# Patient Record
Sex: Male | Born: 1935 | Race: Black or African American | Hispanic: No | Smoking: Never smoker
Health system: Southern US, Community
[De-identification: ages and names within clinical notes are randomized; demographics above are authoritative.]

## PROBLEM LIST (undated history)

## (undated) DIAGNOSIS — Z9989 Dependence on other enabling machines and devices: Secondary | ICD-10-CM

## (undated) DIAGNOSIS — I2510 Atherosclerotic heart disease of native coronary artery without angina pectoris: Secondary | ICD-10-CM

## (undated) DIAGNOSIS — K219 Gastro-esophageal reflux disease without esophagitis: Secondary | ICD-10-CM

## (undated) DIAGNOSIS — I739 Peripheral vascular disease, unspecified: Secondary | ICD-10-CM

## (undated) DIAGNOSIS — G4733 Obstructive sleep apnea (adult) (pediatric): Secondary | ICD-10-CM

## (undated) DIAGNOSIS — M549 Dorsalgia, unspecified: Secondary | ICD-10-CM

## (undated) DIAGNOSIS — E785 Hyperlipidemia, unspecified: Secondary | ICD-10-CM

## (undated) DIAGNOSIS — N200 Calculus of kidney: Secondary | ICD-10-CM

## (undated) DIAGNOSIS — I10 Essential (primary) hypertension: Secondary | ICD-10-CM

## (undated) DIAGNOSIS — E039 Hypothyroidism, unspecified: Secondary | ICD-10-CM

## (undated) DIAGNOSIS — N400 Enlarged prostate without lower urinary tract symptoms: Secondary | ICD-10-CM

## (undated) HISTORY — PX: CATARACT EXTRACTION: SUR2

## (undated) HISTORY — DX: Essential (primary) hypertension: I10

## (undated) HISTORY — DX: Hypothyroidism, unspecified: E03.9

## (undated) HISTORY — DX: Atherosclerotic heart disease of native coronary artery without angina pectoris: I25.10

## (undated) HISTORY — DX: Benign prostatic hyperplasia without lower urinary tract symptoms: N40.0

## (undated) HISTORY — PX: LITHOTRIPSY: SUR834

## (undated) HISTORY — PX: CARDIAC CATHETERIZATION: SHX172

## (undated) HISTORY — DX: Obstructive sleep apnea (adult) (pediatric): G47.33

## (undated) HISTORY — DX: Gastro-esophageal reflux disease without esophagitis: K21.9

## (undated) HISTORY — DX: Hyperlipidemia, unspecified: E78.5

## (undated) HISTORY — DX: Peripheral vascular disease, unspecified: I73.9

## (undated) HISTORY — DX: Dorsalgia, unspecified: M54.9

## (undated) HISTORY — DX: Dependence on other enabling machines and devices: Z99.89

## (undated) HISTORY — DX: Calculus of kidney: N20.0

---

## 2004-02-27 ENCOUNTER — Other Ambulatory Visit: Payer: Self-pay

## 2005-07-02 ENCOUNTER — Ambulatory Visit: Payer: Self-pay | Admitting: Urology

## 2005-07-14 ENCOUNTER — Ambulatory Visit: Payer: Self-pay | Admitting: Urology

## 2005-08-24 ENCOUNTER — Ambulatory Visit: Payer: Self-pay | Admitting: Urology

## 2005-08-24 ENCOUNTER — Other Ambulatory Visit: Payer: Self-pay

## 2005-09-09 ENCOUNTER — Other Ambulatory Visit: Payer: Self-pay

## 2005-09-09 ENCOUNTER — Ambulatory Visit: Payer: Self-pay | Admitting: Internal Medicine

## 2005-10-07 ENCOUNTER — Ambulatory Visit: Payer: Self-pay | Admitting: Urology

## 2005-12-24 ENCOUNTER — Ambulatory Visit: Payer: Self-pay | Admitting: Otolaryngology

## 2006-01-03 ENCOUNTER — Ambulatory Visit: Payer: Self-pay | Admitting: Otolaryngology

## 2006-01-31 ENCOUNTER — Ambulatory Visit: Payer: Self-pay | Admitting: Otolaryngology

## 2006-02-02 ENCOUNTER — Ambulatory Visit: Payer: Self-pay | Admitting: Otolaryngology

## 2006-07-11 ENCOUNTER — Ambulatory Visit: Payer: Self-pay | Admitting: Urology

## 2007-02-01 ENCOUNTER — Ambulatory Visit: Payer: Self-pay | Admitting: Urology

## 2007-02-08 ENCOUNTER — Ambulatory Visit: Payer: Self-pay | Admitting: Urology

## 2007-03-13 ENCOUNTER — Ambulatory Visit: Payer: Self-pay | Admitting: Urology

## 2007-08-16 ENCOUNTER — Ambulatory Visit: Payer: Self-pay | Admitting: Urology

## 2007-09-29 ENCOUNTER — Ambulatory Visit: Payer: Self-pay | Admitting: Otolaryngology

## 2007-12-08 IMAGING — CR DG ABDOMEN 1V
1 series · 2 of 2 positions shown · non-contrast
Comparison: none

REASON FOR EXAM: Nephrolithiasis
COMMENTS:

[Series 1: view not recorded · 0.17mm/px · 2 of 2 slices shown]
[im 1/2]
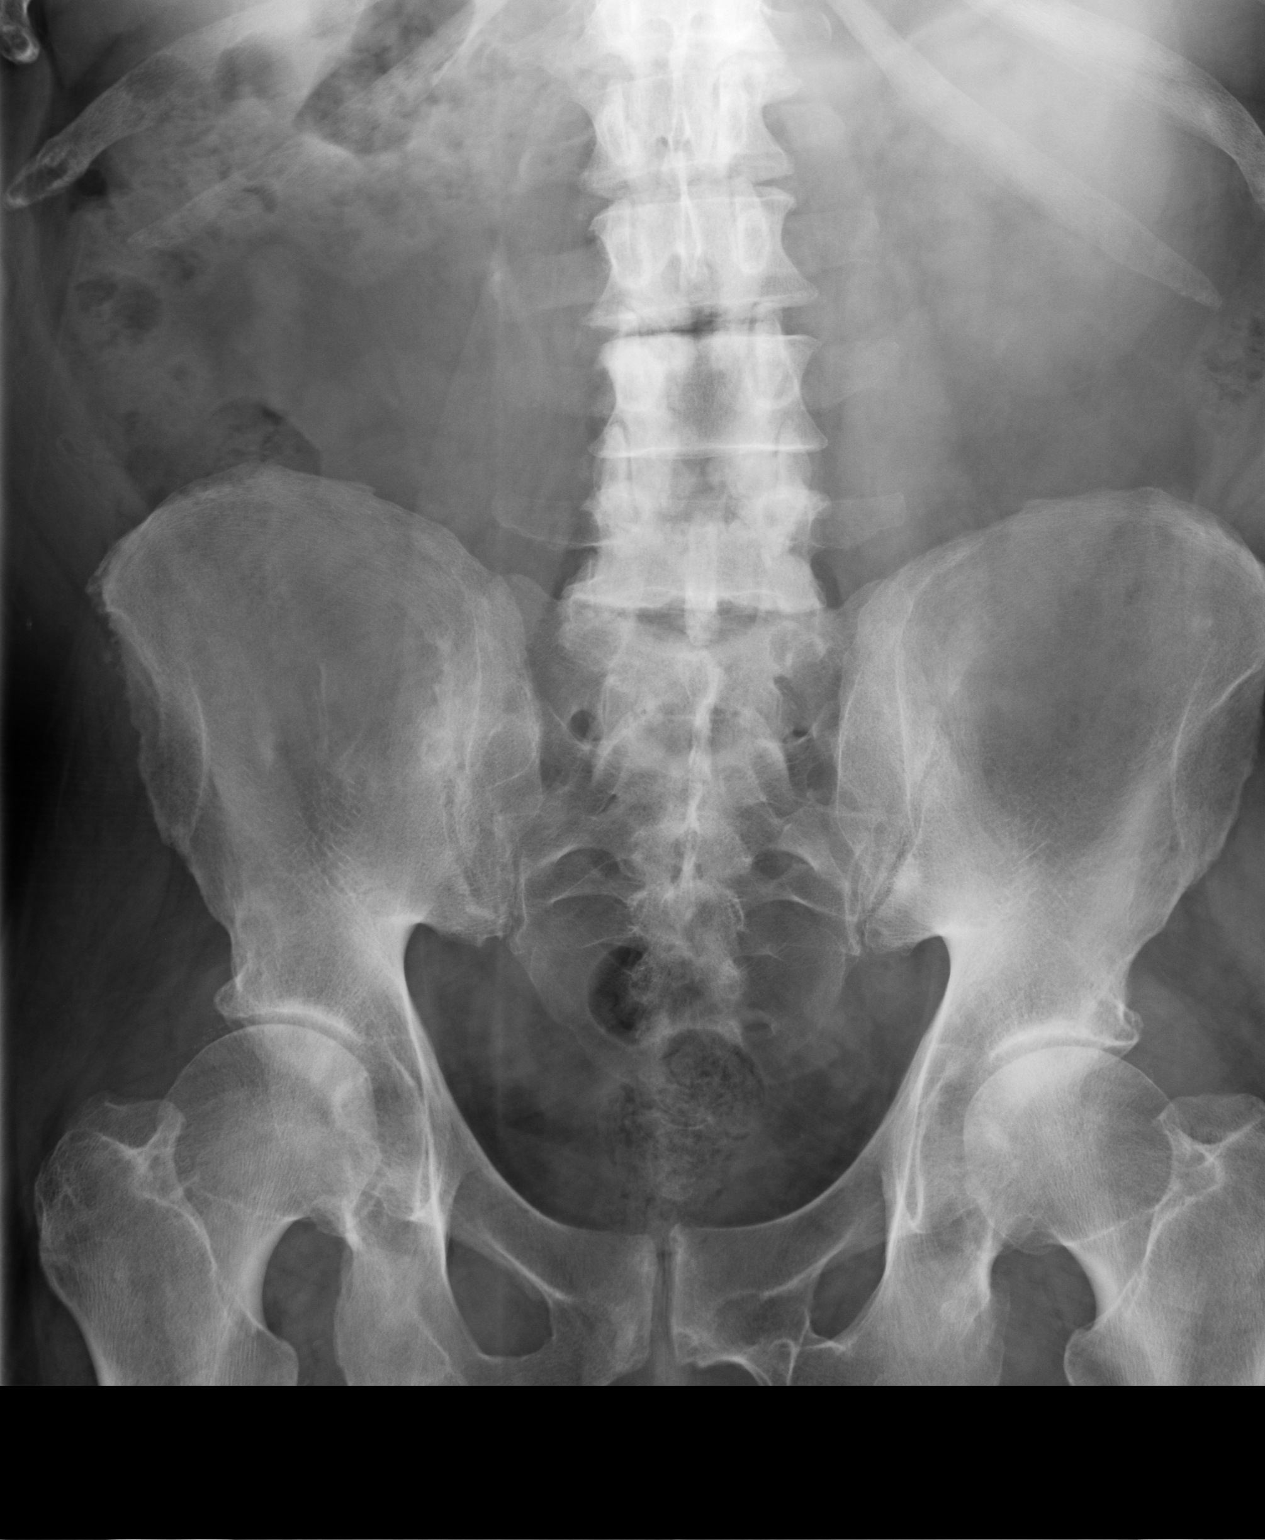
[im 2/2]
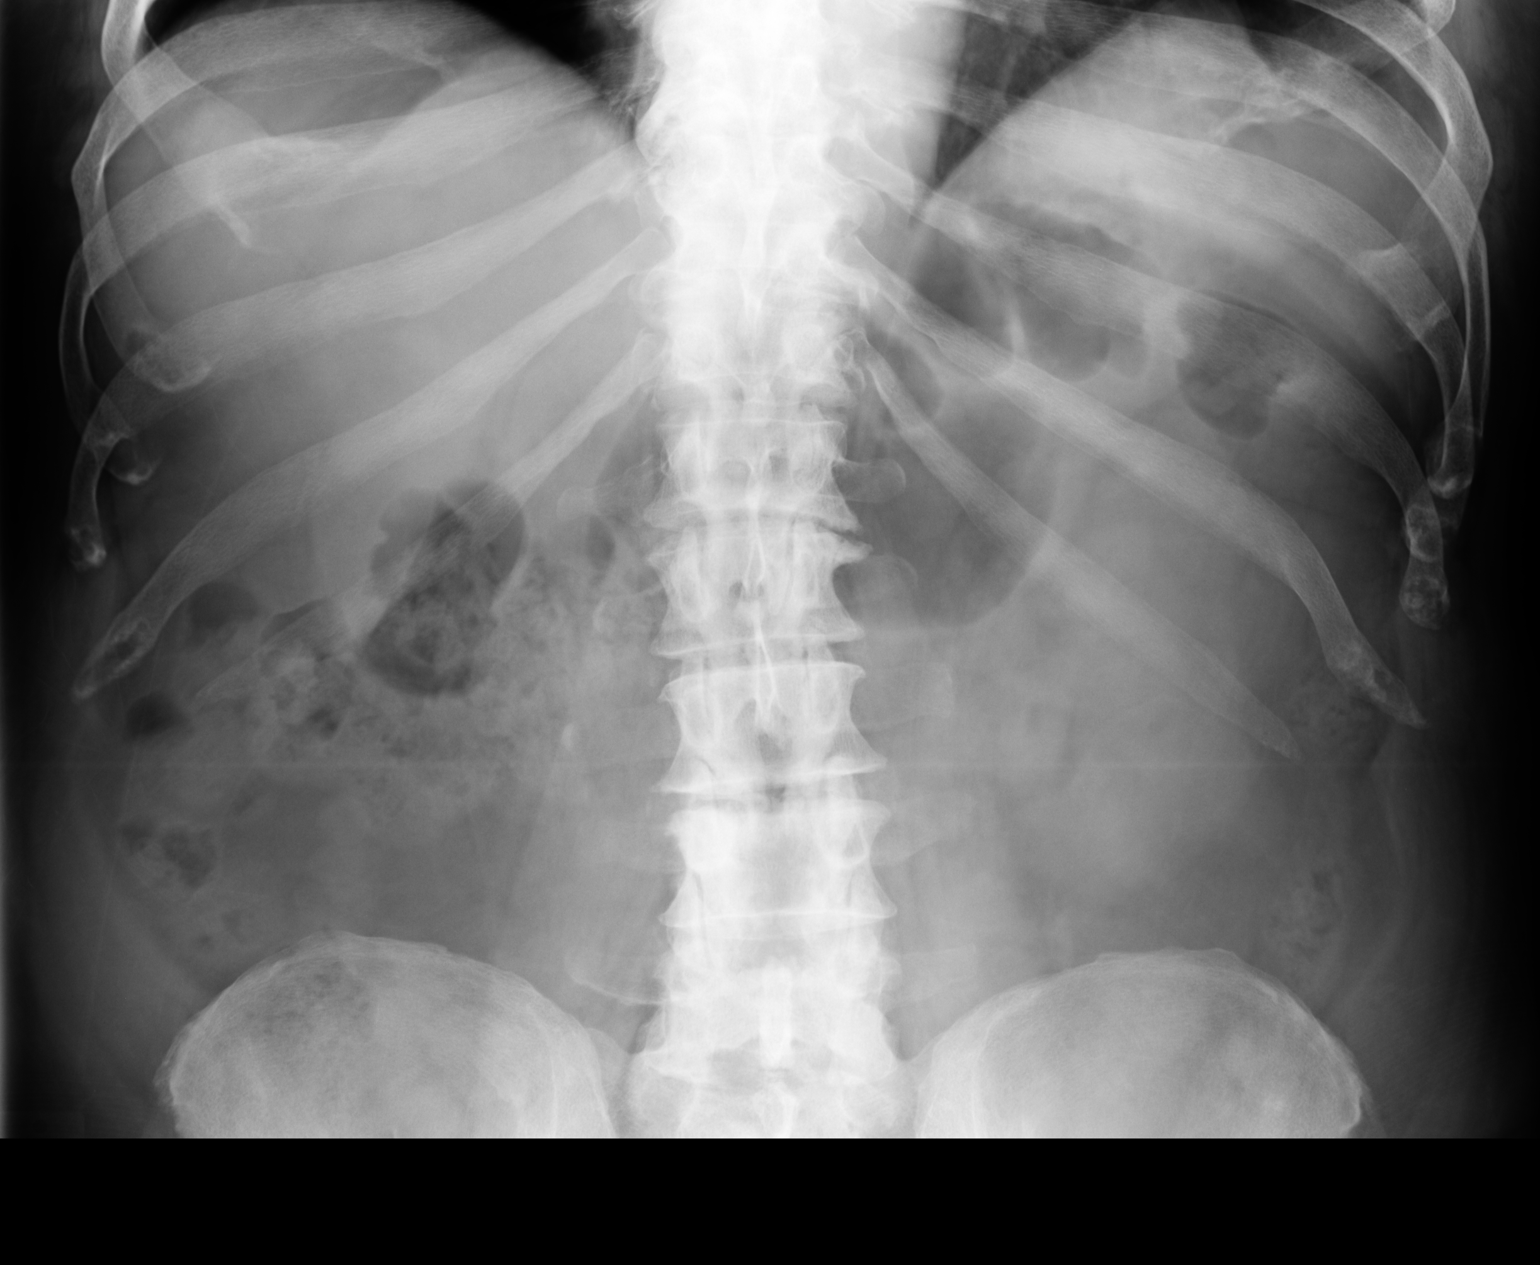

[2 of 2 positions shown; findings below may reference images not displayed]

PROCEDURE:     DXR - DXR KIDNEY URETER BLADDER  - July 14, 2005  [DATE]

RESULT:       The patient has a history of stones.
The KUB films reveal no definite abnormal calcification projecting over
either kidney.  There is a radiodensity on the RIGHT that may lie in the
proximal ureter.  It measures approximately 5 x 8 mm in diameter.  It does
overlie the tip of the RIGHT L3 transverse process and may be related to
this.  There are degenerative changes of the lumbar spine and of the RIGHT
SI joint.
IMPRESSION: I do not see evidence of more than one calcified stone.  This may lie in the
proximal RIGHT ureter.  Further evaluation with an IVP may be of value.

## 2008-02-28 ENCOUNTER — Ambulatory Visit: Payer: Self-pay | Admitting: Urology

## 2008-06-29 ENCOUNTER — Inpatient Hospital Stay: Payer: Self-pay | Admitting: Internal Medicine

## 2008-08-09 ENCOUNTER — Ambulatory Visit: Payer: Self-pay | Admitting: Urology

## 2008-10-31 ENCOUNTER — Ambulatory Visit: Payer: Self-pay | Admitting: Otolaryngology

## 2008-11-05 ENCOUNTER — Ambulatory Visit: Payer: Self-pay | Admitting: Otolaryngology

## 2008-11-25 ENCOUNTER — Ambulatory Visit: Payer: Self-pay | Admitting: Otolaryngology

## 2009-03-03 ENCOUNTER — Ambulatory Visit: Payer: Self-pay | Admitting: Urology

## 2009-03-24 ENCOUNTER — Ambulatory Visit: Payer: Self-pay | Admitting: Urology

## 2009-10-13 ENCOUNTER — Ambulatory Visit: Payer: Self-pay | Admitting: Ophthalmology

## 2009-10-28 ENCOUNTER — Ambulatory Visit: Payer: Self-pay | Admitting: Ophthalmology

## 2010-01-09 IMAGING — CR DG ABDOMEN 1V
1 series · 2 of 2 positions shown · non-contrast
Comparison: none

REASON FOR EXAM: nephrolithasis pt need films
COMMENTS:

[Series 1: view not recorded · 0.17mm/px · 2 of 2 slices shown]
[im 1/2]
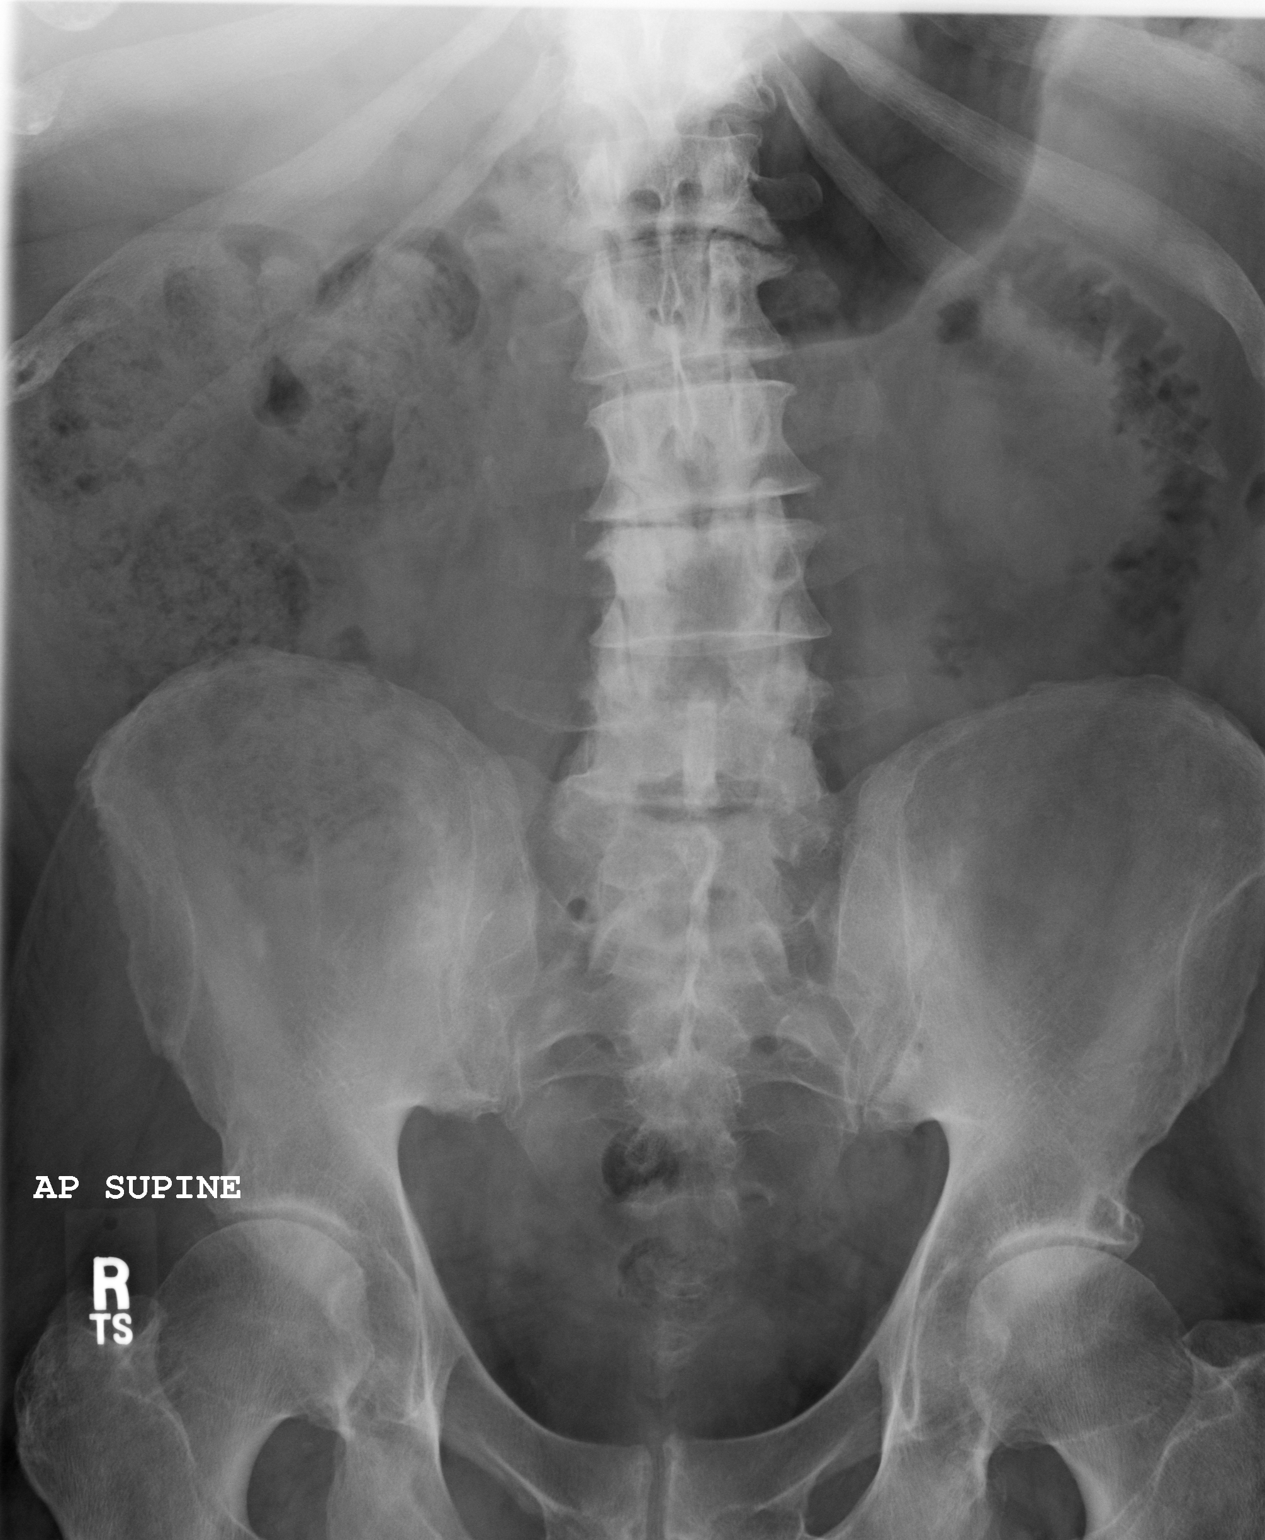
[im 2/2]
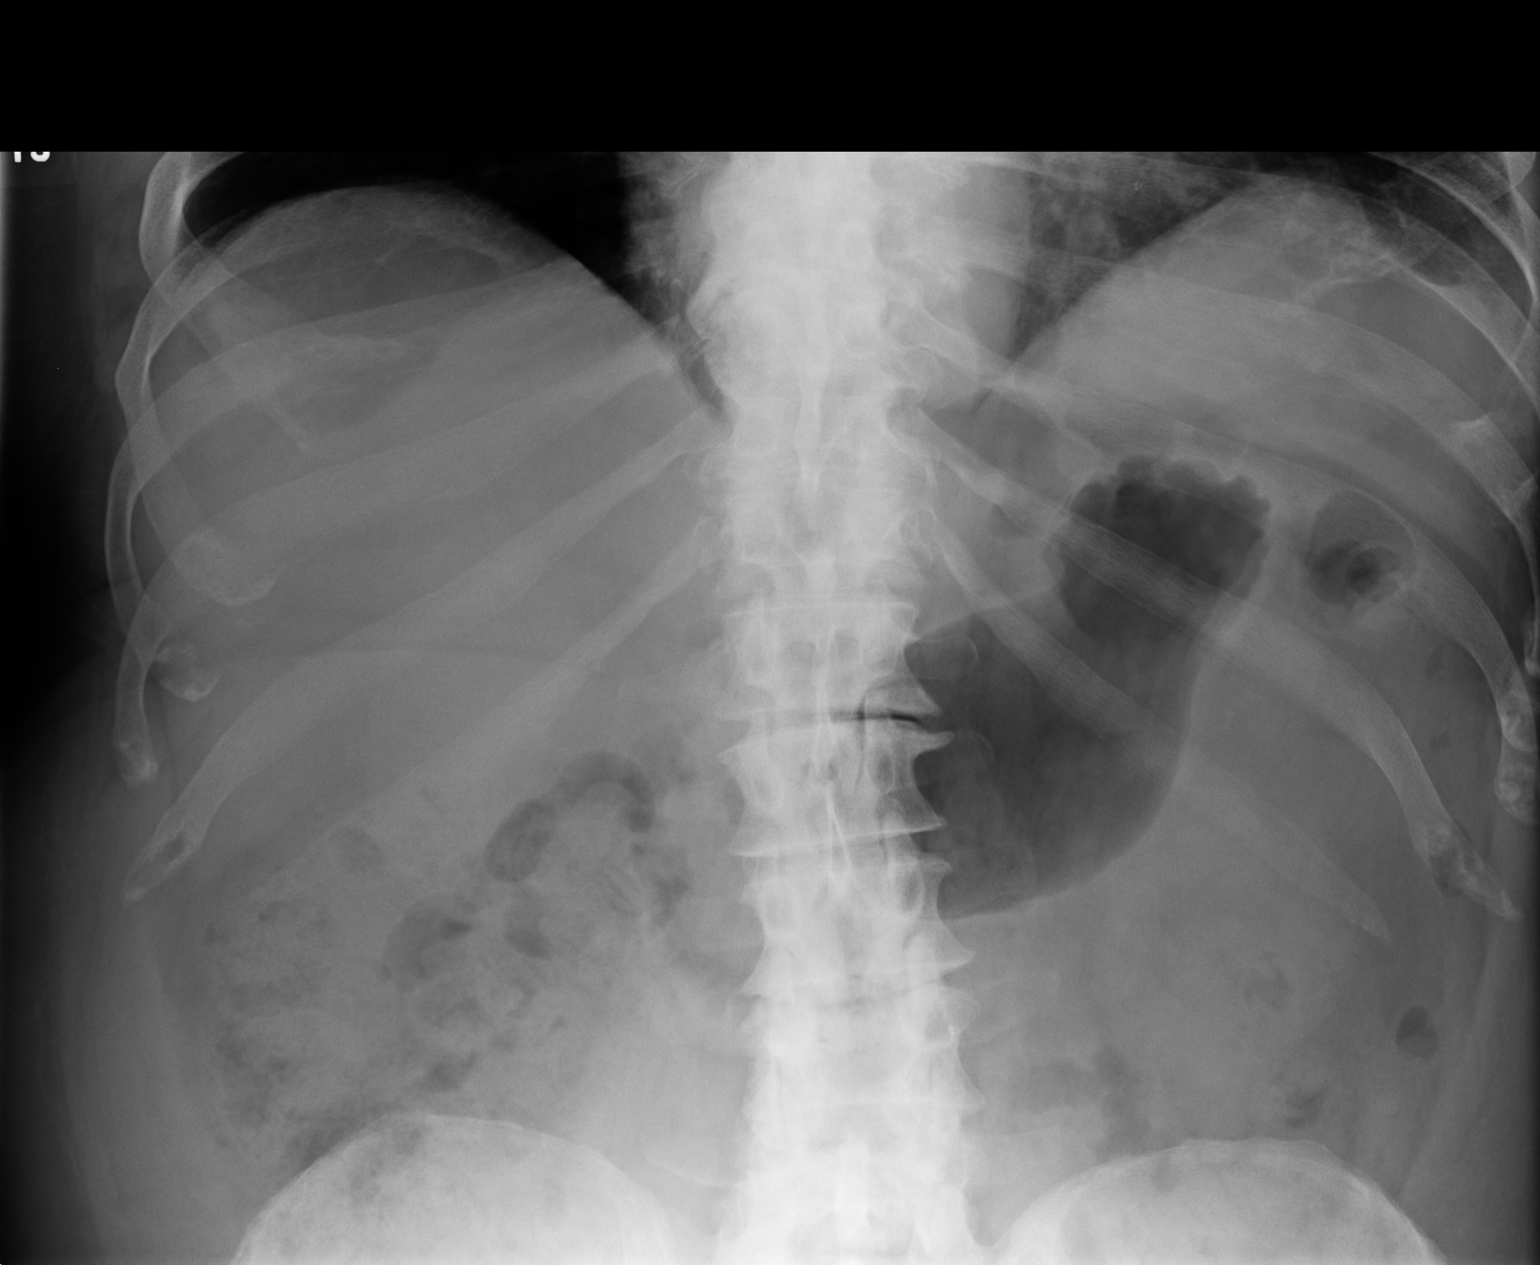

[2 of 2 positions shown; findings below may reference images not displayed]

PROCEDURE:     DXR - DXR KIDNEY URETER BLADDER  - August 16, 2007 [DATE]

RESULT:     Comparison is made to a prior exam of 03/13/2007. No definite
renal or ureteral calcifications are identified on plain film examination.
There are noted degenerative changes of the lumbar spine. There is a
moderate amount of fecal material in the colon.
IMPRESSION: 1.     No definite renal or ureteral calcifications are identified on plain
film examination.

## 2010-03-04 ENCOUNTER — Ambulatory Visit: Payer: Self-pay | Admitting: Urology

## 2010-04-23 ENCOUNTER — Ambulatory Visit: Payer: Self-pay | Admitting: Otolaryngology

## 2010-04-28 ENCOUNTER — Ambulatory Visit: Payer: Self-pay | Admitting: Otolaryngology

## 2010-05-11 ENCOUNTER — Encounter: Payer: Self-pay | Admitting: Otolaryngology

## 2010-05-26 ENCOUNTER — Encounter: Payer: Self-pay | Admitting: Otolaryngology

## 2010-06-02 ENCOUNTER — Ambulatory Visit: Payer: Self-pay | Admitting: Unknown Physician Specialty

## 2010-07-09 ENCOUNTER — Ambulatory Visit: Payer: Self-pay | Admitting: Unknown Physician Specialty

## 2010-07-10 LAB — PATHOLOGY REPORT

## 2010-07-11 ENCOUNTER — Emergency Department: Payer: Self-pay | Admitting: Unknown Physician Specialty

## 2010-07-18 ENCOUNTER — Emergency Department: Payer: Self-pay | Admitting: Emergency Medicine

## 2010-10-24 ENCOUNTER — Inpatient Hospital Stay: Payer: Self-pay | Admitting: Internal Medicine

## 2011-03-23 ENCOUNTER — Ambulatory Visit: Payer: Self-pay | Admitting: Urology

## 2011-08-02 ENCOUNTER — Emergency Department: Payer: Self-pay | Admitting: Emergency Medicine

## 2011-08-02 LAB — URINALYSIS, COMPLETE
Bacteria: NONE SEEN
Bilirubin,UR: NEGATIVE
Glucose,UR: NEGATIVE mg/dL (ref 0–75)
Nitrite: NEGATIVE
Ph: 5 (ref 4.5–8.0)
Specific Gravity: 1.016 (ref 1.003–1.030)
Squamous Epithelial: NONE SEEN

## 2011-08-12 ENCOUNTER — Ambulatory Visit: Payer: Self-pay | Admitting: Urology

## 2011-08-12 LAB — BASIC METABOLIC PANEL
Anion Gap: 10 (ref 7–16)
Chloride: 104 mmol/L (ref 98–107)
Co2: 30 mmol/L (ref 21–32)
Creatinine: 1.28 mg/dL (ref 0.60–1.30)
EGFR (Non-African Amer.): 57 — ABNORMAL LOW
Glucose: 87 mg/dL (ref 65–99)
Osmolality: 289 (ref 275–301)

## 2011-08-12 LAB — CBC WITH DIFFERENTIAL/PLATELET
Basophil #: 0 10*3/uL (ref 0.0–0.1)
Basophil %: 0.3 %
Eosinophil #: 0.1 10*3/uL (ref 0.0–0.7)
Lymphocyte #: 1.4 10*3/uL (ref 1.0–3.6)
Lymphocyte %: 13.7 %
MCHC: 32.3 g/dL (ref 32.0–36.0)
Monocyte #: 0.8 10*3/uL — ABNORMAL HIGH (ref 0.0–0.7)
Neutrophil #: 7.7 10*3/uL — ABNORMAL HIGH (ref 1.4–6.5)
Neutrophil %: 77.1 %
RBC: 4.61 10*6/uL (ref 4.40–5.90)
RDW: 16.6 % — ABNORMAL HIGH (ref 11.5–14.5)

## 2011-08-16 ENCOUNTER — Ambulatory Visit: Payer: Self-pay | Admitting: Urology

## 2012-05-03 ENCOUNTER — Ambulatory Visit: Payer: Self-pay | Admitting: Gastroenterology

## 2013-05-13 ENCOUNTER — Emergency Department: Payer: Self-pay | Admitting: Emergency Medicine

## 2013-05-13 LAB — CBC
HCT: 35.3 % — ABNORMAL LOW (ref 40.0–52.0)
HGB: 11.8 g/dL — ABNORMAL LOW (ref 13.0–18.0)
MCH: 29 pg (ref 26.0–34.0)
MCHC: 33.5 g/dL (ref 32.0–36.0)
MCV: 87 fL (ref 80–100)
RDW: 16.9 % — ABNORMAL HIGH (ref 11.5–14.5)
WBC: 6.4 10*3/uL (ref 3.8–10.6)

## 2013-05-13 LAB — COMPREHENSIVE METABOLIC PANEL
Anion Gap: 7 (ref 7–16)
BUN: 15 mg/dL (ref 7–18)
Bilirubin,Total: 0.5 mg/dL (ref 0.2–1.0)
Calcium, Total: 8.8 mg/dL (ref 8.5–10.1)
Creatinine: 1.34 mg/dL — ABNORMAL HIGH (ref 0.60–1.30)
EGFR (African American): 55 — ABNORMAL LOW
Osmolality: 282 (ref 275–301)

## 2013-05-13 LAB — URINALYSIS, COMPLETE
Bilirubin,UR: NEGATIVE
Blood: NEGATIVE
Ketone: NEGATIVE
Leukocyte Esterase: NEGATIVE
Nitrite: NEGATIVE
Ph: 7 (ref 4.5–8.0)
Protein: NEGATIVE
Specific Gravity: 1.01 (ref 1.003–1.030)

## 2013-05-13 LAB — TROPONIN I
Troponin-I: 0.13 ng/mL — ABNORMAL HIGH
Troponin-I: 0.13 ng/mL — ABNORMAL HIGH

## 2013-05-13 LAB — LIPASE, BLOOD: Lipase: 118 U/L (ref 73–393)

## 2013-10-03 ENCOUNTER — Ambulatory Visit: Payer: Self-pay | Admitting: Internal Medicine

## 2014-02-01 ENCOUNTER — Inpatient Hospital Stay: Payer: Self-pay | Admitting: Internal Medicine

## 2014-02-01 LAB — LIPASE, BLOOD: LIPASE: 156 U/L (ref 73–393)

## 2014-02-01 LAB — URINALYSIS, COMPLETE
BACTERIA: NONE SEEN
BILIRUBIN, UR: NEGATIVE
Glucose,UR: NEGATIVE mg/dL (ref 0–75)
Hyaline Cast: 12
KETONE: NEGATIVE
Leukocyte Esterase: NEGATIVE
NITRITE: NEGATIVE
Ph: 5 (ref 4.5–8.0)
Protein: 30
RBC,UR: 1 /HPF (ref 0–5)
SPECIFIC GRAVITY: 1.008 (ref 1.003–1.030)
Squamous Epithelial: NONE SEEN

## 2014-02-01 LAB — COMPREHENSIVE METABOLIC PANEL
Albumin: 3.3 g/dL — ABNORMAL LOW (ref 3.4–5.0)
Alkaline Phosphatase: 135 U/L — ABNORMAL HIGH
Anion Gap: 8 (ref 7–16)
BUN: 29 mg/dL — AB (ref 7–18)
Bilirubin,Total: 1.5 mg/dL — ABNORMAL HIGH (ref 0.2–1.0)
CO2: 24 mmol/L (ref 21–32)
Calcium, Total: 8.9 mg/dL (ref 8.5–10.1)
Chloride: 110 mmol/L — ABNORMAL HIGH (ref 98–107)
Creatinine: 2.48 mg/dL — ABNORMAL HIGH (ref 0.60–1.30)
EGFR (African American): 26 — ABNORMAL LOW
GFR CALC NON AF AMER: 22 — AB
Glucose: 85 mg/dL (ref 65–99)
OSMOLALITY: 288 (ref 275–301)
POTASSIUM: 4.5 mmol/L (ref 3.5–5.1)
SGOT(AST): 40 U/L — ABNORMAL HIGH (ref 15–37)
SGPT (ALT): 21 U/L (ref 12–78)
SODIUM: 142 mmol/L (ref 136–145)
TOTAL PROTEIN: 6.9 g/dL (ref 6.4–8.2)

## 2014-02-01 LAB — CBC
HCT: 37.3 % — ABNORMAL LOW (ref 40.0–52.0)
HGB: 11.9 g/dL — ABNORMAL LOW (ref 13.0–18.0)
MCH: 27.3 pg (ref 26.0–34.0)
MCHC: 31.8 g/dL — ABNORMAL LOW (ref 32.0–36.0)
MCV: 86 fL (ref 80–100)
Platelet: 161 10*3/uL (ref 150–440)
RBC: 4.36 10*6/uL — ABNORMAL LOW (ref 4.40–5.90)
RDW: 18.2 % — AB (ref 11.5–14.5)
WBC: 5.8 10*3/uL (ref 3.8–10.6)

## 2014-02-01 LAB — CK-MB: CK-MB: 3.6 ng/mL (ref 0.5–3.6)

## 2014-02-01 LAB — TROPONIN I
TROPONIN-I: 0.21 ng/mL — AB
TROPONIN-I: 0.23 ng/mL — AB
Troponin-I: 0.19 ng/mL — ABNORMAL HIGH

## 2014-02-01 LAB — CK TOTAL AND CKMB (NOT AT ARMC)
CK, Total: 196 U/L
CK, Total: 250 U/L
CK-MB: 4 ng/mL — ABNORMAL HIGH (ref 0.5–3.6)
CK-MB: 4.7 ng/mL — AB (ref 0.5–3.6)

## 2014-02-01 LAB — PRO B NATRIURETIC PEPTIDE: B-Type Natriuretic Peptide: 27854 pg/mL — ABNORMAL HIGH (ref 0–450)

## 2014-02-02 LAB — BASIC METABOLIC PANEL
Anion Gap: 8 (ref 7–16)
BUN: 30 mg/dL — ABNORMAL HIGH (ref 7–18)
Calcium, Total: 9.2 mg/dL (ref 8.5–10.1)
Chloride: 106 mmol/L (ref 98–107)
Co2: 28 mmol/L (ref 21–32)
Creatinine: 2.49 mg/dL — ABNORMAL HIGH (ref 0.60–1.30)
GFR CALC AF AMER: 26 — AB
GFR CALC NON AF AMER: 22 — AB
GLUCOSE: 96 mg/dL (ref 65–99)
Osmolality: 289 (ref 275–301)
POTASSIUM: 4 mmol/L (ref 3.5–5.1)
Sodium: 142 mmol/L (ref 136–145)

## 2014-02-02 LAB — CBC WITH DIFFERENTIAL/PLATELET
BASOS PCT: 0.8 %
Basophil #: 0.1 10*3/uL (ref 0.0–0.1)
EOS ABS: 0.1 10*3/uL (ref 0.0–0.7)
EOS PCT: 0.9 %
HCT: 41.5 % (ref 40.0–52.0)
HGB: 13.2 g/dL (ref 13.0–18.0)
LYMPHS ABS: 1 10*3/uL (ref 1.0–3.6)
Lymphocyte %: 14.6 %
MCH: 26.7 pg (ref 26.0–34.0)
MCHC: 31.8 g/dL — AB (ref 32.0–36.0)
MCV: 84 fL (ref 80–100)
Monocyte #: 0.8 x10 3/mm (ref 0.2–1.0)
Monocyte %: 10.9 %
NEUTROS ABS: 5 10*3/uL (ref 1.4–6.5)
Neutrophil %: 72.8 %
PLATELETS: 171 10*3/uL (ref 150–440)
RBC: 4.93 10*6/uL (ref 4.40–5.90)
RDW: 19.2 % — AB (ref 11.5–14.5)
WBC: 6.9 10*3/uL (ref 3.8–10.6)

## 2014-02-02 LAB — PHOSPHORUS: PHOSPHORUS: 3.7 mg/dL (ref 2.5–4.9)

## 2014-02-03 LAB — BASIC METABOLIC PANEL
Anion Gap: 9 (ref 7–16)
BUN: 31 mg/dL — ABNORMAL HIGH (ref 7–18)
CALCIUM: 8.8 mg/dL (ref 8.5–10.1)
CO2: 29 mmol/L (ref 21–32)
Chloride: 104 mmol/L (ref 98–107)
Creatinine: 2.35 mg/dL — ABNORMAL HIGH (ref 0.60–1.30)
EGFR (African American): 28 — ABNORMAL LOW
GFR CALC NON AF AMER: 24 — AB
Glucose: 90 mg/dL (ref 65–99)
OSMOLALITY: 289 (ref 275–301)
POTASSIUM: 3.4 mmol/L — AB (ref 3.5–5.1)
Sodium: 142 mmol/L (ref 136–145)

## 2014-02-04 LAB — BASIC METABOLIC PANEL
ANION GAP: 8 (ref 7–16)
BUN: 31 mg/dL — AB (ref 7–18)
CALCIUM: 8.7 mg/dL (ref 8.5–10.1)
CO2: 35 mmol/L — AB (ref 21–32)
Chloride: 97 mmol/L — ABNORMAL LOW (ref 98–107)
Creatinine: 2.14 mg/dL — ABNORMAL HIGH (ref 0.60–1.30)
EGFR (Non-African Amer.): 27 — ABNORMAL LOW
GFR CALC AF AMER: 31 — AB
Glucose: 85 mg/dL (ref 65–99)
Osmolality: 285 (ref 275–301)
POTASSIUM: 3.4 mmol/L — AB (ref 3.5–5.1)
Sodium: 140 mmol/L (ref 136–145)

## 2014-02-04 LAB — PROTEIN / CREATININE RATIO, URINE
CREATININE, URINE: 17.1 mg/dL — AB (ref 30.0–125.0)
Protein, Random Urine: 14 mg/dL — ABNORMAL HIGH (ref 0–12)
Protein/Creat. Ratio: 819 mg/gCREAT — ABNORMAL HIGH (ref 0–200)

## 2014-02-04 LAB — MAGNESIUM: MAGNESIUM: 1.6 mg/dL — AB

## 2014-02-06 LAB — UR PROT ELECTROPHORESIS, URINE RANDOM

## 2014-02-07 LAB — PROTEIN ELECTROPHORESIS(ARMC)

## 2014-03-05 ENCOUNTER — Ambulatory Visit: Payer: Self-pay | Admitting: Family

## 2014-03-08 ENCOUNTER — Ambulatory Visit: Payer: Self-pay | Admitting: Ophthalmology

## 2014-03-19 ENCOUNTER — Ambulatory Visit: Payer: Self-pay | Admitting: Family

## 2014-04-04 ENCOUNTER — Ambulatory Visit: Payer: Self-pay | Admitting: Family

## 2014-04-04 LAB — BASIC METABOLIC PANEL
ANION GAP: 7 (ref 7–16)
BUN: 21 mg/dL — ABNORMAL HIGH (ref 7–18)
CHLORIDE: 105 mmol/L (ref 98–107)
CO2: 30 mmol/L (ref 21–32)
Calcium, Total: 8.6 mg/dL (ref 8.5–10.1)
Creatinine: 2.13 mg/dL — ABNORMAL HIGH (ref 0.60–1.30)
EGFR (African American): 31 — ABNORMAL LOW
EGFR (Non-African Amer.): 27 — ABNORMAL LOW
Glucose: 60 mg/dL — ABNORMAL LOW (ref 65–99)
Osmolality: 284 (ref 275–301)
Potassium: 3.7 mmol/L (ref 3.5–5.1)
Sodium: 142 mmol/L (ref 136–145)

## 2014-04-05 ENCOUNTER — Encounter: Payer: Self-pay | Admitting: Cardiovascular Disease

## 2014-04-18 ENCOUNTER — Ambulatory Visit: Payer: Self-pay | Admitting: Family

## 2014-06-13 ENCOUNTER — Ambulatory Visit: Payer: Self-pay | Admitting: Family

## 2014-08-13 ENCOUNTER — Ambulatory Visit: Payer: Self-pay | Admitting: Family

## 2014-08-27 ENCOUNTER — Ambulatory Visit: Payer: Self-pay | Admitting: Family

## 2014-10-25 ENCOUNTER — Ambulatory Visit: Payer: Self-pay

## 2014-10-26 DIAGNOSIS — K219 Gastro-esophageal reflux disease without esophagitis: Secondary | ICD-10-CM

## 2014-10-26 DIAGNOSIS — I5022 Chronic systolic (congestive) heart failure: Secondary | ICD-10-CM

## 2014-11-16 NOTE — Consult Note (Signed)
Chief Complaint:  Subjective/Chief Complaint Pt states to be feeeling a little better, The swelling in his legs has improved slightly.   VITAL SIGNS/ANCILLARY NOTES: **Vital Signs.:   11-Jul-15 07:59  Vital Signs Type Pre Medication  Temperature Temperature (F) 97.1  Celsius 36.1  Temperature Source oral  Pulse Pulse 74  Respirations Respirations 20  Systolic BP Systolic BP 625  Diastolic BP (mmHg) Diastolic BP (mmHg) 88  Mean BP 111  Pulse Ox % Pulse Ox % 100  Pulse Ox Activity Level  At rest  Oxygen Delivery Room Air/ 21 %  *Intake and Output.:   11-Jul-15 11:30  Grand Totals Intake:  120 Output:      Net:  120 24 Hr.:  -280  Oral Intake      In:  120    11:30  Percentage of Meal Eaten  100  Rehab Summary:   11-Jul-15 12:50  Anticipated Discharge Disposition  home with home health   Brief Assessment:  GEN well developed, well nourished, no acute distress   Cardiac Regular  murmur present  -- LE edema  + JVD  brady   Respiratory normal resp effort   Gastrointestinal Normal   Gastrointestinal details normal Soft   EXTR negative cyanosis/clubbing, positive edema   Lab Results:  LabObservation:  10-Jul-15 09:04   OBSERVATION Reason for Test Edema    13:23   OBSERVATION Reason for Test  Hepatic:  10-Jul-15 08:12   Bilirubin, Total  1.5  Alkaline Phosphatase  135 (45-117 NOTE: New Reference Range 06/15/13)  SGPT (ALT) 21  SGOT (AST)  40  Total Protein, Serum 6.9  Albumin, Serum  3.3  Cardiology:  10-Jul-15 13:23   Echo Doppler REASON FOR EXAM:     COMMENTS:     PROCEDURE: [place] - ECHO DOPPLER COMPLETE(TRANSTHOR)  - Feb 01 2014  1:23PM   RESULT: Echocardiogram Report  Patient Name:   Dennis Rice Date of Exam: 02/01/2014 Medical Rec #:  [id number]          Custom1: Date of Birth:  [birth year]       Height:       70.0 in Patient Age:    81 years        Weight:       230.0 lb Patient Gender: M               BSA:          2.22 m??  Indications:  CHF Sonographer:    Janalee Dane RCS Referring Phys: Glad[place] Lighter  Summary:  1. Left ventricular ejection fraction, by visual estimation, is 30 to  35%.  2. Impaired relaxation pattern of LV diastolic filling.  3. Severe concentric left ventricular hypertrophy.  4. Severely increased left ventricular septal thickness.  5. Moderately reduced RV systolic function.  6. Moderately dilated left atrium.  7. Mildly dilated right atrium.  8. Mild mitral valve regurgitation.  9. Mild aortic regurgitation. 10. Mild aortic valve sclerosis without stenosis. 11. Moderate tricuspid regurgitation. 12. Moderately elevated pulmonary artery systolic pressure. 13. Severely increased left ventricular posterior wall thickness. 2D AND M-MODE MEASUREMENTS (normal ranges within parentheses): Left Ventricle:      Normal IVSd (2D):      1.90 cm (0.7-1.1) LVPWd (2D):     2.01 cm (0.7-1.1) Aorta/LA:                  Normal LVIDd (2D):     3.79 cm (3.4-5.7) Aortic Root (2D): 3.90 cm (  2.4-3.7) LVIDs (2D):     3.25 cm           Left Atrium (2D): 5.00 cm (1.9-4.0) LV FS (2D):     14.2 %   (>25%) LV EF (2D):     30.9 %   (>50%)                                   Right Ventricle:                                   RVd (2D):        3.62 cm SPECTRAL DOPPLER ANALYSIS (where applicable): Tricuspid Valve and PA/RV Systolic Pressure: TR Max Velocity: 3.52 m/s RA  Pressure: 15 mmHg RVSP/PASP: 64.6 mmHg PHYSICIAN INTERPRETATION: Left Ventricle: The left ventricular internal cavity size was normal. LV  septal wall thickness was severely increased. LV posterior wall thickness  was severely increased. Severe concentric left ventricular hypertrophy.  Left ventricular ejection fraction, by visual estimation, is 30 to 35%.  Spectral Doppler shows impaired relaxation pattern of LV diastolic  filling. Right Ventricle: The right ventricular size is normal. Global RV systolic  function is moderately reduced. Left  Atrium: The left atrium is moderately dilated. Right Atrium: The right atrium is mildly dilated. Pericardium: There is no evidence of pericardial effusion. Mitral Valve: The mitral valve is normal in structure. Mild mitral valve  regurgitation is seen. Tricuspid Valve: The tricuspid valve is normal. Moderate tricuspid   regurgitation is visualized. The tricuspid regurgitant velocity is 3.52  m/s, and with an assumed right atrial pressure of 15 mmHg, the estimated  right ventricular systolic pressure is moderately elevated at 64.6 mmHg. Aortic Valve: The aortic valve is normal. Mild aortic valve sclerosis is  present, with no evidence of aortic valve stenosis. Mild aortic valve  regurgitation is seen. Pulmonic Valve: The pulmonic valve is normal.  Maries MD Electronically signed by Cordes Lakes MD Signature Date/Time: 02/02/2014/1:09:29 PM  *** Final ***  IMPRESSION: .  Verified By: Yolonda Kida, M.D., MD  Routine Chem:  10-Jul-15 08:12   Glucose, Serum 85  BUN  29  Creatinine (comp)  2.48  Sodium, Serum 142  Potassium, Serum 4.5  Chloride, Serum  110  CO2, Serum 24  Calcium (Total), Serum 8.9  Anion Gap 8  Osmolality (calc) 288  eGFR (African American)  26  eGFR (Non-African American)  22 (eGFR values <72m/min/1.73 m2 may be an indication of chronic kidney disease (CKD). Calculated eGFR is useful in patients with stable renal function. The eGFR calculation will not be reliable in acutely ill patients when serum creatinine is changing rapidly. It is not useful in  patients on dialysis. The eGFR calculation may not be applicable to patients at the low and high extremes of body sizes, pregnant women, and vegetarians.)  Result Comment Troponin - RESULTS VERIFIED BY REPEAT TESTING.  - Notified BRoda Shutters@ [phone]  - 02/01/14.SFJ  - READ-BACK PROCESS PERFORMED.  Result(s) reported on 01 Feb 2014 at 09:07AM.  B-Type Natriuretic Peptide  ([facility]  [phone](Result(s) reported on 01 Feb 2014 at 08:58AM.)  Lipase 156 (Result(s) reported on 01 Feb 2014 at 08:58AM.)    12:03   Result Comment Troponin - RESULTS VERIFIED BY REPEAT TESTING.  - Previously called @ [phone] 02/01/14  - by  [email or web address].SFJ  Result(s) reported on 01 Feb 2014 at 01:11PM.    17:01   Result Comment TROPONIN - RESULTS VERIFIED BY REPEAT TESTING.  - PREVIOUSLY CALLED TO BRIDGET ALLISON BY  - SFJ ON 02/01/14 AT 0903. [place]  Result(s) reported on 01 Feb 2014 at 06:04PM.  11-Jul-15 06:40   BUN  30  Cardiac:  10-Jul-15 08:12   Troponin I  0.19 (0.00-0.05 0.05 ng/mL or less: NEGATIVE  Repeat testing in 3-6 hrs  if clinically indicated. >0.05 ng/mL: POTENTIAL  MYOCARDIAL INJURY. Repeat  testing in 3-6 hrs if  clinically indicated. NOTE: An increase or decrease  of 30% or more on serial  testing suggests a  clinically important change)  CPK-MB, Serum 3.6 (Result(s) reported on 01 Feb 2014 at 10:38AM.)    12:03   Troponin I  0.21 (0.00-0.05 0.05 ng/mL or less: NEGATIVE  Repeat testing in 3-6 hrs  if clinically indicated. >0.05 ng/mL: POTENTIAL  MYOCARDIAL INJURY. Repeat  testing in 3-6 hrs if  clinically indicated. NOTE: An increase or decrease  of 30% or more on serial  testing suggests a  clinically important change)  CK, Total 196 (39-308 NOTE: NEW REFERENCE RANGE  08/27/2013)  CPK-MB, Serum  4.0 (Result(s) reported on 01 Feb 2014 at 12:58PM.)    17:01   Troponin I  0.23 (0.00-0.05 0.05 ng/mL or less: NEGATIVE  Repeat testing in 3-6 hrs  if clinically indicated. >0.05 ng/mL: POTENTIAL  MYOCARDIAL INJURY. Repeat  testing in 3-6 hrs if  clinically indicated. NOTE: An increase or decrease  of 30% or more on serial  testing suggests a  clinically important change)  CK, Total 250 (39-308 NOTE: NEW REFERENCE RANGE  08/27/2013)  CPK-MB, Serum  4.7 (Result(s) reported on 01 Feb 2014 at 06:00PM.)  Routine UA:  10-Jul-15 10:20   Color (UA) Yellow   Clarity (UA) Clear  Glucose (UA) Negative  Bilirubin (UA) Negative  Ketones (UA) Negative  Specific Gravity (UA) 1.008  Blood (UA) 1+  pH (UA) 5.0  Protein (UA) 30 mg/dL  Nitrite (UA) Negative  Leukocyte Esterase (UA) Negative (Result(s) reported on 01 Feb 2014 at 10:57AM.)  RBC (UA) 1 /HPF  WBC (UA) 1 /HPF  Bacteria (UA) NONE SEEN  Epithelial Cells (UA) NONE SEEN  Hyaline Cast (UA) 12 /LPF (Result(s) reported on 01 Feb 2014 at 10:57AM.)  Routine Hem:  10-Jul-15 08:12   WBC (CBC) 5.8  RBC (CBC)  4.36  Hemoglobin (CBC)  11.9  Hematocrit (CBC)  37.3  Platelet Count (CBC) 161 (Result(s) reported on 01 Feb 2014 at 08:34AM.)  MCV 86  MCH 27.3  MCHC  31.8  RDW  18.2   Radiology Results: XRay:    10-Jul-15 09:23, Chest PA and Lateral  Chest PA and Lateral   REASON FOR EXAM:    Shortness of Breath  COMMENTS:   May transport without cardiac monitor    PROCEDURE: DXR - DXR CHEST PA (OR AP) AND LATERAL  - Feb 01 2014  9:23AM     CLINICAL DATA:  Shortness of breath    EXAM:  CHEST  2 VIEW    COMPARISON:  10/03/2013; 10/24/2010; chest CT - 06/02/2010    FINDINGS:  Grossly unchanged enlarged cardiac silhouette and mediastinal  contours given persistently reduced lung volumes. Grossly unchanged  small likely partially loculated right-sided effusion with  associated bibasilar opacities. Pulmonary vasculature remains  indistinct with cephalization of flow. Is grossly unchanged pleural  parenchymal thickening about the right minor in the  bilateral major  fissures appear no pneumothorax. Unchanged bones.     IMPRESSION:  Grossly unchanged findings of hypoventilation, pulmonary edema and  small bilateral effusions with associated bibasilar opacities, right  greater than left, atelectasis versus infiltrate.      Electronically Signed    By: Sandi Mariscal M.D.    On: 02/01/2014 09:26     Verified By: Aileen Fass, M.D.,  [place]:    10-Jul-15 09:04, [place] Color Flow Doppler Lower  Extrem Right (Leg)  [place] Color Flow Doppler Lower Extrem Right (Leg)   REASON FOR EXAM:    Edema  COMMENTS:       PROCEDURE: [place]  - US DOPPLER LOW EXTR RIGHT  - Feb 01 2014  9:04AM     CLINICAL DATA:  Right leg pain and swelling    EXAM:  Right LOWER EXTREMITY VENOUS DOPPLER ULTRASOUND    TECHNIQUE:  Gray-scale sonography with graded compression, as well as color  Doppler and duplex ultrasound were performed to evaluate the lower  extremity deep venous systems from the level of the common femoral  vein and including the common femoral, femoral, profunda femoral,  popliteal and calf veins including the posterior tibial, peroneal  and gastrocnemius veins when visible. The superficial great  saphenous vein was also interrogated. Spectral Doppler was utilized  to evaluate flow at rest and with distal augmentation maneuvers in  the common femoral, femoral and popliteal veins.    COMPARISON:  None.    FINDINGS:  Common Femoral Vein: No evidence of thrombus. Normal  compressibility, respiratory phasicity and response to augmentation.    Saphenofemoral Junction: No evidence of thrombus. Normal  compressibility and flow on color Doppler imaging.  Profunda Femoral Vein: No evidence of thrombus. Normal  compressibility and flow on color Doppler imaging.    Femoral Vein: No evidence of thrombus. Normal compressibility,  respiratory phasicity and response to augmentation.    Popliteal Vein: No evidence of thrombus. Normal compressibility,  respiratory phasicity and response to augmentation.    Calf Veins: No evidence of thrombus. Normal compressibility and flow  on color Doppler imaging.    Superficial Great Saphenous Vein: No evidence of thrombus. Normal  compressibility and flow on color Doppler imaging.  Venous Reflux:  None.    Other Findings:  Diffuse lower extremity edema is noted.     IMPRESSION:  Diffuse lower extremity edema noted. No evidence of deep venous  thrombosis in  the right leg.      Electronically Signed    By: Inez Catalina M.D.    On: 02/01/2014 09:05         Verified By: Everlene Farrier, M.D.,    10-Jul-15 11:32, US Kidney Bilateral  US Kidney Bilateral   REASON FOR EXAM:    acute renal failure  COMMENTS:       PROCEDURE: [place]  - US KIDNEY  - Feb 01 2014 11:32AM     CLINICAL DATA:  Acute renal failure    EXAM:  RENAL/URINARY TRACT ULTRASOUND COMPLETE    COMPARISON:  CT abdomen and pelvis February 08, 2007    FINDINGS:  Right Kidney:  Length: 8.4 cm. Right kidney is echogenic with renal cortical  thinning. There is a cyst in the upper pole of the right kidney  measuring 3.1 x 3.1 x 2.9 cm. There is a cyst in the medial right  kidney measuring 2.0 x 1.7 cm. A cyst also in the mid right kidney  measures 1.0 x 0.8  cm. No noncystic renal masses are identified on  the right. There is no pelvicaliectasis or perinephric fluid  collection on the right. There is no sonographically demonstrable  calculus or ureterectasis.    Left Kidney:    Length: 9.4 cm. Left kidney shows renal cortical thinning but  echogenicity within normal limits. There is a cyst arising from the  upper pole the left kidney measuring 5.5 x 4.0 x 4.9 cm. Multiple  smaller cysts are noted throughout the left kidney. No noncystic  renal masses are identified on the left. There is no  pelvicaliectasis or perinephric fluid collection on the left. There  is no sonographically demonstrable calculus or ureterectasis.    Bladder:    Appears normal for degree of bladder distention.     IMPRESSION:  Kidneys are small with renal cortical thinning. Right kidney is  echogenic. Echogenicity of the left kidney is normal. These findings  are felt to represent medical renal disease. Multiple renal cysts  may be seen with chronic renal failure. There is no obstructing  focus or noncystic renal mass on either side.  Electronically Signed    By: Lowella Grip M.D.    On:  02/01/2014 11:37         Verified By: Leafy Kindle. Jasmine December, M.D.,  Cardiology:    10-Jul-15 13:23, Echo Doppler  Echo Doppler   REASON FOR EXAM:      COMMENTS:       PROCEDURE: [facility] - ECHO DOPPLER COMPLETE(TRANSTHOR)  - Feb 01 2014  1:23PM     RESULT: Echocardiogram Report    Patient Name:   Dennis Rice Date of Exam: 02/01/2014  Medical Rec #:  [id number]          Custom1:  Date of Birth:  1923/12/10       Height:       70.0 in  Patient Age:    81 years        Weight:       230.0 lb  Patient Gender: M               BSA:          2.22 m??    Indications: CHF  Sonographer:    Janalee Dane RCS  Referring Phys: [place] Lighter    Summary:   1. Left ventricular ejection fraction, by visual estimation, is 30 to   35%.   2. Impaired relaxation pattern of LV diastolic filling.   3. Severe concentric left ventricular hypertrophy.   4. Severely increased left ventricular septal thickness.   5. Moderately reduced RV systolic function.   6. Moderately dilated left atrium.   7. Mildly dilated right atrium.   8. Mild mitral valve regurgitation.   9. Mild aortic regurgitation.  10. Mild aortic valve sclerosis without stenosis.  11. Moderate tricuspid regurgitation.  12. Moderately elevated pulmonary artery systolic pressure.  13. Severely increased left ventricular posterior wall thickness.  2D AND M-MODE MEASUREMENTS (normal ranges within parentheses):  Left Ventricle:      Normal  IVSd (2D):      1.90 cm (0.7-1.1)  LVPWd (2D):     2.01 cm (0.7-1.1) Aorta/LA:                  Normal  LVIDd (2D):     3.79 cm (3.4-5.7) Aortic Root (2D): 3.90 cm (2.4-3.7)  LVIDs (2D):     3.25 cm           Left Atrium (  2D): 5.00 cm (1.9-4.0)  LV FS (2D):     14.2 %   (>25%)  LV EF (2D):     30.9 %   (>50%)                                    Right Ventricle:                                    RVd (2D):        3.62 cm  SPECTRAL DOPPLER ANALYSIS (where applicable):  Tricuspid Valve and PA/RV Systolic  Pressure: TR Max Velocity: 3.52 m/s RA   Pressure: 15 mmHg RVSP/PASP: 64.6 mmHg  PHYSICIAN INTERPRETATION:  Left Ventricle: The left ventricular internal cavity size was normal. LV   septal wall thickness was severely increased. LV posterior wall thickness   was severely increased. Severe concentric left ventricular hypertrophy.   Left ventricular ejection fraction, by visual estimation, is 30 to 35%.   Spectral Doppler shows impaired relaxation pattern of LV diastolic   filling.  Right Ventricle: The right ventricular size is normal. Global RV systolic   function is moderately reduced.  Left Atrium: The left atrium is moderately dilated.  Right Atrium: The right atrium is mildly dilated.  Pericardium: There is no evidence of pericardial effusion.  Mitral Valve: The mitral valve is normal in structure. Mild mitral valve   regurgitation is seen.  Tricuspid Valve: The tricuspid valve is normal. Moderate tricuspid     regurgitation is visualized. The tricuspid regurgitant velocity is 3.52   m/s, and with an assumed right atrial pressure of 15 mmHg, the estimated   right ventricular systolic pressure is moderately elevated at 64.6 mmHg.  Aortic Valve: The aortic valve is normal. Mild aortic valve sclerosis is   present, with no evidence of aortic valve stenosis. Mild aortic valve   regurgitation is seen.  Pulmonic Valve: The pulmonic valve is normal.    [place] MD  Electronically signed by [place] MD  Signature Date/Time: 02/02/2014/1:09:29 PM    *** Final ***    IMPRESSION: .    Verified By: Yolonda Kida, M.D., MD  CT:    10-Jul-15 15:29, CT Chest Without Contrast  CT Chest Without Contrast   REASON FOR EXAM:    chf, atelectasis  COMMENTS:       PROCEDURE: CT  - CT CHEST WITHOUT CONTRAST  - Feb 01 2014  3:29PM     CLINICAL DATA:  Shortness of breath and cough.    EXAM:  CT CHEST WITHOUT CONTRAST    TECHNIQUE:  Multidetector CT imaging of  the chest was performed following the  standard protocol without IV contrast..    COMPARISON:  02/01/2014 and chest CT 06/02/2010  FINDINGS:  There is diffuse subcutaneous edema. The heart is at least mildly  enlarged with a small pericardial effusion. There are small  bilateral pleural effusions. The coronary arteries are heavily  calcified. There does not appear to be significant chest  lymphadenopathy but difficult to evaluate on this non-contrast  examination. Images of the upper abdomen demonstrate perihepatic and  perisplenic ascites and difficult to exclude cirrhosis. Again noted  is a low-density structure along the right kidney upper pole  suggestive for a renal cyst.    The trachea and mainstem bronchi are patent. There  are patchy  parenchymal densities in the medial right upper lobe. There is a  linear or tubular shaped density in the right middle lobe on  sequence 3, image 32. Volume loss in the right lower lobe with  patchy parenchymal densities. Pleural-based density in the right  middle lobe. There is volume loss in the left lower lobe with  interstitial densities. Streaky parenchymal densities in the  lingula.    Multilevel degenerative changes in the thoracic spine.     IMPRESSION:  Scattered areas of parenchymal lung disease, most prominent in  medial right upper lobe. Findings are suggestive for multi-focal  pneumonia.    Evidence for anasarca demonstrated by subcutaneous edema, small  pleural effusions, small pericardial effusion and abdominal ascites.  Difficult to exclude underlying liver disease.  Coronary arteries are heavily calcified.      Electronically Signed    By: Markus Daft M.D.    On: 02/01/2014 16:01         Verified By: Burman Riis, M.D.,   Assessment/Plan:  Assessment/Plan:  Assessment IMP CHF Edema AFIB Bradycardia HTN CM Demand ischemia CRI .   Plan PLAN Tele Diuretics Bp control Support stocking Agree wiith  PT/OT ECHO with mod severeCM I do not rec cath Continue conservative CHF therapy with meds Continue Eliquis/Atenolol for AFIB   Electronic Signatures: Yolonda Kida (MD)  (Signed 12-Jul-15 07:19)  Authored: Chief Complaint, VITAL SIGNS/ANCILLARY NOTES, Brief Assessment, Lab Results, Radiology Results, Assessment/Plan   Last Updated: 12-Jul-15 07:19 by Lujean Amel D (MD)

## 2014-11-16 NOTE — Consult Note (Signed)
Chief Complaint:  Subjective/Chief Complaint He still has weakness and edema.Pt states to be feeeling a little better, The swelling in his legs has improved slightly. He abulating to the bathroom.   VITAL SIGNS/ANCILLARY NOTES: **Vital Signs.:   12-Jul-15 11:40  Vital Signs Type Routine  Pulse Pulse 77  Respirations Respirations 22  Systolic BP Systolic BP 115  Diastolic BP (mmHg) Diastolic BP (mmHg) 82  Mean BP 100  Pulse Ox % Pulse Ox % 98  Pulse Ox Activity Level  At rest  Oxygen Delivery Room Air/ 21 %  *Intake and Output.:   12-Jul-15 08:15  Grand Totals Intake:  240 Output:      Net:  240 24 Hr.:  240  Oral Intake      In:  240  Percentage of Meal Eaten  100   Brief Assessment:  GEN well developed, well nourished, no acute distress   Cardiac Regular  murmur present  -- LE edema  + JVD  brady   Respiratory normal resp effort   Gastrointestinal Normal   Gastrointestinal details normal Soft   EXTR negative cyanosis/clubbing, positive edema   Lab Results: Routine Chem:  12-Jul-15 07:33   Glucose, Serum 90  BUN  31  Creatinine (comp)  2.35  Sodium, Serum 142  Potassium, Serum  3.4  Chloride, Serum 104  CO2, Serum 29  Calcium (Total), Serum 8.8  Anion Gap 9  Osmolality (calc) 289  eGFR (African American)  28  eGFR (Non-African American)  24 (eGFR values <79m/min/1.73 m2 may be an indication of chronic kidney disease (CKD). Calculated eGFR is useful in patients with stable renal function. The eGFR calculation will not be reliable in acutely ill patients when serum creatinine is changing rapidly. It is not useful in  patients on dialysis. The eGFR calculation may not be applicable to patients at the low and high extremes of body sizes, pregnant women, and vegetarians.)   Radiology Results: XRay:    10-Jul-15 09:23, Chest PA and Lateral  Chest PA and Lateral   REASON FOR EXAM:    Shortness of Breath  COMMENTS:   May transport without cardiac  monitor    PROCEDURE: DXR - DXR CHEST PA (OR AP) AND LATERAL  - Feb 01 2014  9:23AM     CLINICAL DATA:  Shortness of breath    EXAM:  CHEST  2 VIEW    COMPARISON:  10/03/2013; 10/24/2010; chest CT - 06/02/2010    FINDINGS:  Grossly unchanged enlarged cardiac silhouette and mediastinal  contours given persistently reduced lung volumes. Grossly unchanged  small likely partially loculated right-sided effusion with  associated bibasilar opacities. Pulmonary vasculature remains  indistinct with cephalization of flow. Is grossly unchanged pleural  parenchymal thickening about the right minor in the bilateral major  fissures appear no pneumothorax. Unchanged bones.     IMPRESSION:  Grossly unchanged findings of hypoventilation, pulmonary edema and  small bilateral effusions with associated bibasilar opacities, right  greater than left, atelectasis versus infiltrate.      Electronically Signed    By: JSandi MariscalM.D.    On: 02/01/2014 09:26     Verified By: JAileen Fass M.D.,  [place]    10-Jul-15 09:04, [place] Flow Doppler Lower Extrem Right (Leg)  [place] Flow Doppler Lower Extrem Right (Leg)   REASON FOR EXAM:    Edema  COMMENTS:       PROCEDURE: [place] - [place]OW EXTR RIGHT  - Feb 01 2014  9:04AM     CLINICAL DATA:  Right leg pain and swelling    EXAM:  Right LOWER EXTREMITY VENOUS DOPPLER ULTRASOUND    TECHNIQUE:  Gray-scale sonography with graded compression, as well as color  Doppler and duplex ultrasound were performed to evaluate the lower  extremity deep venous systems from the level of the common femoral  vein and including the common femoral, femoral, profunda femoral,  popliteal and calf veins including the posterior tibial, peroneal  and gastrocnemius veins when visible. The superficial great  saphenous vein was also interrogated. Spectral Doppler was utilized  to evaluate flow at rest and with distal augmentation maneuvers in  the common femoral,  femoral and popliteal veins.    COMPARISON:  None.    FINDINGS:  Common Femoral Vein: No evidence of thrombus. Normal  compressibility, respiratory phasicity and response to augmentation.    Saphenofemoral Junction: No evidence of thrombus. Normal  compressibility and flow on color Doppler imaging.  Profunda Femoral Vein: No evidence of thrombus. Normal  compressibility and flow on color Doppler imaging.    Femoral Vein: No evidence of thrombus. Normal compressibility,  respiratory phasicity and response to augmentation.    Popliteal Vein: No evidence of thrombus. Normal compressibility,  respiratory phasicity and response to augmentation.    Calf Veins: No evidence of thrombus. Normal compressibility and flow  on color Doppler imaging.    Superficial Great Saphenous Vein: No evidence of thrombus. Normal  compressibility and flow on color Doppler imaging.  Venous Reflux:  None.    Other Findings:  Diffuse lower extremity edema is noted.     IMPRESSION:  Diffuse lower extremity edema noted. No evidence of deep venous  thrombosis in the right leg.      Electronically Signed    By: Inez Catalina M.D.    On: 02/01/2014 09:05         Verified By: Everlene Farrier, M.D.,    10-Jul-15 11:32, US Kidney Bilateral  US Kidney Bilateral   REASON FOR EXAM:    acute renal failure  COMMENTS:       PROCEDURE: [place]  - US KIDNEY  - Feb 01 2014 11:32AM     CLINICAL DATA:  Acute renal failure    EXAM:  RENAL/URINARY TRACT ULTRASOUND COMPLETE    COMPARISON:  CT abdomen and pelvis February 08, 2007    FINDINGS:  Right Kidney:  Length: 8.4 cm. Right kidney is echogenic with renal cortical  thinning. There is a cyst in the upper pole of the right kidney  measuring 3.1 x 3.1 x 2.9 cm. There is a cyst in the medial right  kidney measuring 2.0 x 1.7 cm. A cyst also in the mid right kidney  measures 1.0 x 0.8 cm. No noncystic renal masses are identified on  the right. There is no  pelvicaliectasis or perinephric fluid  collection on the right. There is no sonographically demonstrable  calculus or ureterectasis.    Left Kidney:    Length: 9.4 cm. Left kidney shows renal cortical thinning but  echogenicity within normal limits. There is a cyst arising from the  upper pole the left kidney measuring 5.5 x 4.0 x 4.9 cm. Multiple  smaller cysts are noted throughout the left kidney. No noncystic  renal masses are identified on the left. There is no  pelvicaliectasis or perinephric fluid collection on the left. There  is no sonographically demonstrable calculus or ureterectasis.    Bladder:    Appears normal for  degree of bladder distention.     IMPRESSION:  Kidneys are small with renal cortical thinning. Right kidney is  echogenic. Echogenicity of the left kidney is normal. These findings  are felt to represent medical renal disease. Multiple renal cysts  may be seen with chronic renal failure. There is no obstructing  focus or noncystic renal mass on either side.  Electronically Signed    By: Lowella Grip M.D.    On: 02/01/2014 11:37         Verified By: Leafy Kindle. Jasmine December, M.D.,  Cardiology:    10-Jul-15 08:29, ED ECG  Ventricular Rate 69  Atrial Rate 300  QRS Duration 86  QT 428  QTc 458  R Axis -4  T Axis 169  ECG interpretation   Atrial fibrillation with premature ventricular or aberrantly conducted complexes  Low voltage QRS  Cannot rule out Anterior infarct (cited on or before 13-May-2013)  Abnormal ECG  When compared with ECG of 13-May-2013 17:04,  No significant change was found  ----------unconfirmed----------  Confirmed by OVERREAD, NOT ([id number]), editor PEARSON, BARBARA ([id number]) on 02/04/2014 8:19:54 AM  ED ECG     10-Jul-15 13:23, Echo Doppler  Echo Doppler   REASON FOR EXAM:      COMMENTS:       PROCEDURE: [place] - ECHO DOPPLER COMPLETE(TRANSTHOR)  - Feb 01 2014  1:23PM     RESULT: Echocardiogram Report    Patient Name:   Dennis Rice Date of Exam: 02/01/2014  Medical Rec #:  [id number]          Custom1:  Date of Birth:  11/02/1923       Height:       70.0 in  Patient Age:    81 years        Weight:       230.0 lb  Patient Gender: M               BSA:          2.22 m??    Indications: CHF  Sonographer:    Janalee Dane RCS  Referring Phys: [place] Lighter    Summary:   1. Left ventricular ejection fraction, by visual estimation, is 30 to   35%.   2. Impaired relaxation pattern of LV diastolic filling.   3. Severe concentric left ventricular hypertrophy.   4. Severely increased left ventricular septal thickness.   5. Moderately reduced RV systolic function.   6. Moderately dilated left atrium.   7. Mildly dilated right atrium.   8. Mild mitral valve regurgitation.   9. Mild aortic regurgitation.  10. Mild aortic valve sclerosis without stenosis.  11. Moderate tricuspid regurgitation.  12. Moderately elevated pulmonary artery systolic pressure.  13. Severely increased left ventricular posterior wall thickness.  2D AND M-MODE MEASUREMENTS (normal ranges within parentheses):  Left Ventricle:      Normal  IVSd (2D):      1.90 cm (0.7-1.1)  LVPWd (2D):     2.01 cm (0.7-1.1) Aorta/LA:                  Normal  LVIDd (2D):     3.79 cm (3.4-5.7) Aortic Root (2D): 3.90 cm (2.4-3.7)  LVIDs (2D):     3.25 cm           Left Atrium (2D): 5.00 cm (1.9-4.0)  LV FS (2D):     14.2 %   (>25%)  LV EF (2D):     30.9 %   (>  50%)                                    Right Ventricle:                                    RVd (2D):        3.62 cm  SPECTRAL DOPPLER ANALYSIS (where applicable):  Tricuspid Valve and PA/RV Systolic Pressure: TR Max Velocity: 3.52 m/s RA   Pressure: 15 mmHg RVSP/PASP: 64.6 mmHg  PHYSICIAN INTERPRETATION:  Left Ventricle: The left ventricular internal cavity size was normal. LV   septal wall thickness was severely increased. LV posterior wall thickness   was severely increased. Severe concentric left  ventricular hypertrophy.   Left ventricular ejection fraction, by visual estimation, is 30 to 35%.   Spectral Doppler shows impaired relaxation pattern of LV diastolic   filling.  Right Ventricle: The right ventricular size is normal. Global RV systolic   function is moderately reduced.  Left Atrium: The left atrium is moderately dilated.  Right Atrium: The right atrium is mildly dilated.  Pericardium: There is no evidence of pericardial effusion.  Mitral Valve: The mitral valve is normal in structure. Mild mitral valve   regurgitation is seen.  Tricuspid Valve: The tricuspid valve is normal. Moderate tricuspid     regurgitation is visualized. The tricuspid regurgitant velocity is 3.52   m/s, and with an assumed right atrial pressure of 15 mmHg, the estimated   right ventricular systolic pressure is moderately elevated at 64.6 mmHg.  Aortic Valve: The aortic valve is normal. Mild aortic valve sclerosis is   present, with no evidence of aortic valve stenosis. Mild aortic valve   regurgitation is seen.  Pulmonic Valve: The pulmonic valve is normal.    [place] MD  Electronically signed by [place] MD  Signature Date/Time: 02/02/2014/1:09:29 PM    *** Final ***    IMPRESSION: .    Verified By: Yolonda Kida, M.D., MD  CT:    10-Jul-15 15:29, CT Chest Without Contrast  CT Chest Without Contrast   REASON FOR EXAM:    chf, atelectasis  COMMENTS:       PROCEDURE: CT  - CT CHEST WITHOUT CONTRAST  - Feb 01 2014  3:29PM     CLINICAL DATA:  Shortness of breath and cough.    EXAM:  CT CHEST WITHOUT CONTRAST    TECHNIQUE:  Multidetector CT imaging of the chest was performed following the  standard protocol without IV contrast..    COMPARISON:  02/01/2014 and chest CT 06/02/2010  FINDINGS:  There is diffuse subcutaneous edema. The heart is at least mildly  enlarged with a small pericardial effusion. There are small  bilateral pleural effusions. The  coronary arteries are heavily  calcified. There does not appear to be significant chest  lymphadenopathy but difficult to evaluate on this non-contrast  examination. Images of the upper abdomen demonstrate perihepatic and  perisplenic ascites and difficult to exclude cirrhosis. Again noted  is a low-density structure along the right kidney upper pole  suggestive for a renal cyst.    The trachea and mainstem bronchi are patent. There are patchy  parenchymal densities in the medial right upper lobe. There is a  linear or tubular shaped density in the right middle lobe on  sequence 3,  image 32. Volume loss in the right lower lobe with  patchy parenchymal densities. Pleural-based density in the right  middle lobe. There is volume loss in the left lower lobe with  interstitial densities. Streaky parenchymal densities in the  lingula.    Multilevel degenerative changes in the thoracic spine.     IMPRESSION:  Scattered areas of parenchymal lung disease, most prominent in  medial right upper lobe. Findings are suggestive for multi-focal  pneumonia.    Evidence for anasarca demonstrated by subcutaneous edema, small  pleural effusions, small pericardial effusion and abdominal ascites.  Difficult to exclude underlying liver disease.  Coronary arteries are heavily calcified.      Electronically Signed    By: Markus Daft M.D.    On: 02/01/2014 16:01         Verified By: Burman Riis, M.D.,   Assessment/Plan:  Assessment/Plan:  Assessment IMP Weakness CHF Edema AFIB Bradycardia HTN CM Demand ischemia CRI Elevated troponins .   Plan PLAN Elevate legs Consider support stockings Tele Diuretics Bp control Support stocking Agree wiith PT/OT ECHO with mod severeCM I do not rec cath Continue conservative CHF therapy with meds Continue Eliquis/Atenolol for AFIB F/U renal insuff with nephology Medical therapy for edema and heart failure   Electronic Signatures: Lujean Amel D (MD)  (Signed 19-Jul-15 21:01)  Authored: Chief Complaint, VITAL SIGNS/ANCILLARY NOTES, Brief Assessment, Lab Results, Radiology Results, Assessment/Plan   Last Updated: 19-Jul-15 21:01 by Lujean Amel D (MD)

## 2014-11-16 NOTE — H&P (Signed)
PATIENT NAME:  Dennis Rice, Dennis Rice MR#:  [id number] DATE OF BIRTH:  1923-08-25  DATE OF ADMISSION:  02/01/2014  ADMITTING PHYSICIAN: Enid Baas, MD  PRIMARY CARE PHYSICIAN: Serita Sheller. Maryellen Pile, MD   CHIEF COMPLAINT: Difficulty breathing.   HISTORY OF PRESENT ILLNESS: Dennis Rice is an 81 year old elderly African-American male with past medical history significant for coronary artery disease, systolic CHF, last known ejection fraction around 40%, hypertension, peripheral vascular disease and gastroesophageal reflux disease, who presents to the hospital secondary to worsening shortness of breath. The patient says he ambulates at home with a cane, but lately that has been difficult due to worsening dyspnea on exertion. This dyspnea on exertion has been going on for a few months, but over the last 2 to 3 days, his breathing has worsened to the point that he could not lie flat in bed, and even at rest, he was very dyspneic and having trouble breathing. He is also having increased belching, for which he saw 2 different GI doctors and has been on PPIs at this time. The patient is a poor historian. His son is at bedside, and most of the history is obtained from ER physician's notes and also notes from his PCP's office. The patient is noted to have elevated BNP here of 27,000. Chest x-ray with some pulmonary vascular congestion and small effusions, but he has significant pedal edema. The patient's home medications are verified, and it seems like he is not on any aspirin or any diuretics at home. So, he is being admitted for CHF exacerbation. His saturations are fine here on room air.   PAST MEDICAL HISTORY:  1. Hypertension.  2. Systolic CHF with EF of 40%.  3. Coronary artery disease with diffuse artery disease noted on cardiac catheterization in 2012. 4. Chronic constipation.  5. Gastroesophageal reflux disease.  6. CKD, with baseline creatinine around 1.6.   PAST SURGICAL HISTORY:  1. Cataract surgery.   2. Cardiac catheterization.  3. Colonoscopy.   ALLERGIES TO MEDICATIONS: No known drug allergies.   CURRENT HOME MEDICATIONS:  1. Xanax 0.25 mg p.o. b.i.d. p.r.n. for anxiety.  2. Atenolol 25 mg p.o. daily.  3. Cimetidine 400 mg p.o. b.i.d.  4. Imdur 60 mg p.o. daily.  5. Norvasc 5 mg p.o. daily.  6. Prilosec 40 mg p.o. daily.  7. Simvastatin 20 mg p.o. at bedtime.   SOCIAL HISTORY: Lives at home with his wife. Uses a cane for ambulation. Quit smoking several years ago. No alcohol abuse.   FAMILY HISTORY: Significant for hypertension in the family.   REVIEW OF SYSTEMS:  CONSTITUTIONAL: No fever, fatigue or weakness.  EYES: Positive for blurry vision. No inflammation or glaucoma.  ENT: No tinnitus, ear pain, hearing loss, epistaxis or discharge.  RESPIRATORY: Positive for dyspnea. No COPD, cough or wheeze.  CARDIOVASCULAR: No chest pain. Positive for orthopnea and dyspnea on exertion. No palpitations or syncope.  GASTROINTESTINAL: No nausea, vomiting, diarrhea or abdominal pain. Positive for chronic constipation and also gastroesophageal reflux disease.  GENITOURINARY: No dysuria, hematuria, renal calculus, frequency or incontinence.  ENDOCRINE: No polyuria, nocturia, thyroid problems, heat or cold intolerance.  HEMATOLOGY: No anemia, easy bruising or bleeding.  SKIN: No acne, rash or lesions.  MUSCULOSKELETAL: No neck, back, shoulder pain, arthritis or gout.  NEUROLOGICAL: No numbness, weakness, CVA, TIA or seizures.  PSYCHOLOGICAL: No anxiety, insomnia, depression.   PHYSICAL EXAMINATION: VITAL SIGNS: Temperature 98.1 degrees Fahrenheit, pulse 82, respirations 20, blood pressure 159/92, pulse oximetry 96% on room air.  GENERAL: Well-built, well-nourished, disheveled-appearing male sitting in bed, not in any acute distress.  HEENT: Normocephalic, atraumatic. Pupils equal, round and reacting to light. Anicteric sclerae. Extraocular movements intact. There is a saggy likely  lacrimal gland versus tissue in the left eye in the lateral margin. Oropharynx clear, without erythema, mass or exudates.  NECK: Supple. No thyromegaly, JVD or carotid bruits. No lymphadenopathy.  LUNGS: Moving air bilaterally. Fine bibasilar crackles present. No wheeze. No use of accessory muscles for breathing at rest; however, even with minimal exertion, he gets tachypneic.  CARDIOVASCULAR: S1, S2, normal rate and irregular rhythm. No murmurs, rubs or gallops.  ABDOMEN: Soft, nontender, nondistended. No hepatosplenomegaly. Normal bowel sounds.  EXTREMITIES: 3+ edema up to the knees. No clubbing or cyanosis. Feeble dorsalis pedis pulses palpable bilaterally.  SKIN: No acne, rash or lesions.  LYMPHATICS: No cervical lymphadenopathy.  NEUROLOGIC: Cranial nerves intact. No focal motor or sensory deficits.  PSYCHOLOGICAL: The patient is awake, alert, oriented x3.   LABORATORY DATA:  WBC 5.8, hemoglobin 11.9, hematocrit 37.3, platelet count 161.  Sodium 142, potassium 4.5, chloride 110, bicarbonate 24, BUN 29, creatinine 2.48, glucose 85 and calcium of 8.9.  ALT 21, AST 40, alkaline phosphatase 135, total bilirubin 1.5, albumin 3.3.  Troponins were 0.19, CK-MB 3.6.  Lipase 156. BNP is elevated at 27,854.  Ultrasound Doppler of lower extremities showing diffuse lower extremity edema. No evidence of DVT in the right leg.  Chest x-ray showing grossly unchanged findings, hypoventilation, pulmonary edema and small bilateral pleural effusions, small bibasilar opacities, right greater than left, atelectasis versus infiltrate.   ASSESSMENT AND PLAN: An 81 year old male with past medical history significant for coronary artery disease, congestive heart failure, hypertension and gastroesophageal reflux disease, admitted for dyspnea.   1. Acute on chronic systolic congestive heart failure exacerbation. Not on Lasix at home. Start diuresis with Lasix 40 IV b.i.d. Echocardiogram has been ordered, and  cardiology has been consulted. Continue medications from home; however, if the ejection fraction remains low, then will need to add the ACE inhibitor or ARB if his renal function is improving. On the chest x-ray, there is a comment about atelectasis versus pneumonia, probably it is atelectasis with chronic pleural effusions. There is no fever or white count noted; however, will get a plain CT chest without any contrast to see if he needs any antibiotics or not. Will hold off on antibiotics at this time.  2. Atrial fibrillation, likely new onset, not in rapid ventricular response. Continue atenolol. Start renally dosed Eliquis and follow up cardiology recommendations.  3. Acute renal failure on chronic kidney disease, could be cardiorenal syndrome. Started on Lasix. Will monitor. Renal ultrasound. Nephrology has been consulted. Last labs from PCP's office from March 2015 show creatinine of 1.6, which is his baseline.  4. Hypertension. Continue home medications.  5. Coronary artery disease. Cardiac catheterization in 2012 showing diffuse coronary artery disease with medical management recommendations at the time. Troponin is mildly elevated. The patient denies any chest pain. Could be just demand ischemia from congestive heart failure. Recycle and monitor. Started on aspirin as he is not on aspirin at home. Other medications like atenolol, Imdur and statin have been continued. 6. Gastroesophageal reflux disease. Started on Protonix.   CODE STATUS: Full code.   TIME SPENT ON ADMISSION: 50 minutes.   ____________________________ Enid Baasadhika Baily [place]ovan[place], MD rk:lb D: 02/01/2014 10:53:04 ET T: 02/01/2014 11:10:28 ET JOB#: [id number]  cc: Enid Baasadhika Temima Kutsch, MD, <Dictator> Serita ShellerErnest B. Maryellen PileEason, MD Enid BaasADHIKA Rochella Benner MD ELECTRONICALLY SIGNED  02/01/2014 12:00 

## 2014-11-16 NOTE — Consult Note (Signed)
PATIENT NAME:  Dennis Rice, Dennis Rice MR#:  [id number] DATE OF BIRTH:  01/11/1924  DATE OF CONSULTATION:  02/01/2014  REFERRING PHYSICIAN:  Dr. Nemiah Commander  CONSULTING PHYSICIAN:  Ikechukwu Cerny D. Juliann Pares, MD  PRIMARY CARE PHYSICIAN: Toy Cookey, MD  INDICATION FOR CONSULTATION: Shortness of breath, congestive heart failure with edema, history of A-fib.   HISTORY OF PRESENT ILLNESS: The patient is an 81 year old black male with history of significant medical problems, coronary artery disease, systolic congestive heart failure with ejection fraction of around 40%, hypertension, peripheral vascular disease, reflux, bradycardia, and A-fib who presented with worsening shortness of breath and leg edema. The patient ambulates with a cane but lately has had difficulty because of dyspnea on exertion. Has been going on for the last few months, but has gotten progressively worse over the last 2 to 3 days. He has trouble lying flat in bed and became very dyspneic. Denied any chest pain. He came to Emergency Room after going to his primary physician. Reportedly had an elevated BNP of 27,000. Chest x-ray suggested pulmonary vascular congestion with effusion. He states he has been compliant with his medication, but is not on any aspirin. He was taking a diuretic. He was admitted for heart failure.   PAST MEDICAL HISTORY: Again, it is notable for hypertension, congestive heart failure with EF around 40%, coronary artery disease, chronic constipation, reflux, renal insufficiency, atrial fibrillation, bradycardia.  PAST SURGICAL HISTORY: Cataract surgery, catheterization, colonoscopy.  ALLERGIES: No known drug allergies.   MEDICATIONS: Xanax 0.25 twice a day p.r.n. anxiety, atenolol 25 daily, cimetidine 400 mg twice a day, Imdur 60 a day, Norvasc 5 mg a day, Prilosec 40 a day, simvastatin 20 a day.   SOCIAL HISTORY: Lives at home with his wife. He uses a cane. He quit smoking years ago. No alcohol consumption.   FAMILY  HISTORY: Hypertension.  REVIEW OF SYSTEMS: Denies blackout spells or syncope. Denies nausea or vomiting. Denies fever, chills or sweats. No weight loss or weight gain. No hemoptysis or hematemesis. No bright red blood per rectum. Complained of shortness of breath, dyspnea, leg edema, irregular heart beat, mild weakness and fatigue.   PHYSICAL EXAMINATION: VITAL SIGNS: Blood pressure 160/90, pulse 70, respiratory rate 16, afebrile.  HEENT: Normocephalic, atraumatic. Pupils equal and reactive to light.  NECK: Supple with bilateral JVD. LUNGS: Mild rhonchi, a few crackles in the bases. Adequate air movement. No wheezing.  HEART: Irregularly irregular. Systolic ejection murmur at the apex. Soft S3. Positive S4.  ABDOMEN: Benign.  EXTREMITIES: 2 to 3+ pitting edema.  NEUROLOGIC: Intact.  SKIN: Normal.  DIAGNOSTIC DATA: White count 5.8, hemoglobin 11.9, hematocrit 37, platelet count 161,000. Sodium 142, potassium 4.5, chloride 110, bicarb 24, BUN 29, creatinine  . Glucose 85, calcium 8.9. LFTs are negative. Troponin 0.19. Lipase 156. BNP 27,000.   Ultrasound of lower extremities: No evidence of DVT.  Chest x-ray shows pulmonary vacular congestion with mild cardiomegaly.   IMPRESSION: 1.  Acute on chronic systolic heart failure. 2.  Edema. 3.  Atrial fibrillation. 4.  Renal insufficiency. 5.  Hypertension. 6.  Known coronary artery disease. 7.  Borderline troponins.  PLAN:  1.  I agree with admit. Rule out for myocardial infarction. Follow up troponins. Follow up EKGs. Place on telemetry. Consider echocardiogram for reassessment of left ventricular function. Recommend diuretic therapy intravenously to help with diuresis. Recommend be careful with ACE inhibitors because of renal insufficiency. Would consider hydralazine in addition to his Imdur. Consider Coreg, but he is already on a  beta blocker. May switch instead to help with heart failure therapy and management.  2.  For atrial  fibrillation, continue rate control with beta blocker. Consider long-term anticoagulation. Because of his significant CHADS score, would consider Eliquis twice a day at a reasonably low dose and monitor his risk of bleeding. Would probably give him 2.5 twice a day. 3.  For hypertension, continue current medications. It was previously controlled with atenolol. May consider adding hydralazine to help with heart failure as well as blood pressure. 4.  Coronary artery disease. Presently stable. Continue beta blockers. He is already on Imdur. Follow up with troponins and EKG.  5.  Gastroesophageal reflux disease. Continue reflux therapy with omeprazole. 6.  Renal insufficiency should be followed by nephrology. We will try to stay away from nephrotoxin drugs at this point. 7.  Recommend physical therapy and occupational therapy.  8.  Will consider whether home health care would be necessary and helpful in this elderly patient. Continue to follow the patient and treat him medically for now. I do not recommend cardiac catheterization. Continue to treat for heart failure.  ____________________________ Bobbie Stackwayne D. Juliann Paresallwood, MD ddc:sb D: 02/12/2014 09:19:41 ET T: 02/12/2014 09:55:16 ET JOB#: [id number]  cc: Zierra Laroque D. Juliann Paresallwood, MD, <Dictator> Alwyn PeaWAYNE D Angel Weedon MD ELECTRONICALLY SIGNED 03/08/2014 12:46

## 2014-11-16 NOTE — Consult Note (Signed)
Chief Complaint:  Subjective/Chief Complaint He says the swelling is better  today. He is getting PT. His appitite has been ok.   VITAL SIGNS/ANCILLARY NOTES: **Vital Signs.:   13-Jul-15 11:05  Vital Signs Type Routine  Temperature Temperature (F) 97.7  Celsius 36.5  Temperature Source oral  Pulse Pulse 77  Respirations Respirations 18  Systolic BP Systolic BP 95  Diastolic BP (mmHg) Diastolic BP (mmHg) 56  Mean BP 69  Pulse Ox % Pulse Ox % 94  Pulse Ox Activity Level  At rest  Oxygen Delivery Room Air/ 21 %  *Intake and Output.:   13-Jul-15 13:59  Grand Totals Intake:   Output:  100    Net:  -100 24 Hr.:  -1240  Urine ml     Out:  100  Urinary Method  Void; Urinal   Brief Assessment:  GEN well developed, well nourished, no acute distress   Cardiac Regular  murmur present  -- LE edema  + JVD  brady   Respiratory normal resp effort   Gastrointestinal Normal   Gastrointestinal details normal Soft   EXTR negative cyanosis/clubbing, positive edema   Lab Results: General Ref:  13-Jul-15 04:35   Protein Electrophoresis, Urine Random ========== TEST NAME ==========  ========= RESULTS =========  = REFERENCE RANGE =  UR PROT ELECTROPH-RANDOM  Protein Electro, Random Urine Protein,Total,Urine             [   4.5 mg/dL            ]          0.0-15.0 Albumin, U   [   33.0 %               ]                   Alpha-1-Globulin, U             [   5.5 %                ]                   Alpha-2-Globulin, U             [   17.3 %               ]                   Beta Globulin, U                [   28.0 %           ]                   Gamma Globulin, U               [   16.2 %               ]                   M-Spike, %                      [   Not Observed %       ]      Not Observed Please note:                    [   Final Report         ]  Protein electrophoresis scan will follow via computer, mail, or courier delivery.               [facility]             No: [id number]           [address] [place], [place] [address]           Lindon Romp, MD     [phone]   Result(s) reported on 06 Feb 2014 at 05:23PM.  Routine Chem:  13-Jul-15 04:13   Glucose, Serum 85  BUN  31  Creatinine (comp)  2.14  Sodium, Serum 140  Potassium, Serum  3.4  Chloride, Serum  97  CO2, Serum  35  Calcium (Total), Serum 8.7  Anion Gap 8  Osmolality (calc) 285  eGFR (African American)  31  eGFR (Non-African American)  27 (eGFR values <31m/min/1.73 m2 may be an indication of chronic kidney disease (CKD). Calculated eGFR is useful in patients with stable renal function. The eGFR calculation will not be reliable in acutely ill patients when serum creatinine is changing rapidly. It is not useful in  patients on dialysis. The eGFR calculation may not be applicable to patients at the low and high extremes of body sizes, pregnant women, and vegetarians.)  Magnesium, Serum  1.6 (1.8-2.4 THERAPEUTIC RANGE: 4-7 mg/dL TOXIC: > 10 mg/dL  -----------------------)  Misc Urine Chem:  13-Jul-15 04:35   Creatinine, Urine  17.1  Protein, Random Urine  14  Protein/Creat Ratio (comp)  819 (Result(s) reported on 04 Feb 2014 at 05:45AM.)   Radiology Results: XRay:    10-Jul-15 09:23, Chest PA and Lateral  Chest PA and Lateral   REASON FOR EXAM:    Shortness of Breath  COMMENTS:   May transport without cardiac monitor    PROCEDURE: DXR - DXR CHEST PA (OR AP) AND LATERAL  - Feb 01 2014  9:23AM     CLINICAL DATA:  Shortness of breath    EXAM:  CHEST  2 VIEW    COMPARISON:  10/03/2013; 10/24/2010; chest CT - 06/02/2010    FINDINGS:  Grossly unchanged enlarged cardiac silhouette and mediastinal  contours given persistently reduced lung volumes. Grossly unchanged  small likely partially loculated right-sided effusion with  associated bibasilar opacities. Pulmonary vasculature remains  indistinct with cephalization of flow. Is grossly unchanged  pleural  parenchymal thickening about the right minor in the bilateral major  fissures appear no pneumothorax. Unchanged bones.     IMPRESSION:  Grossly unchanged findings of hypoventilation, pulmonary edema and  small bilateral effusions with associated bibasilar opacities, right  greater than left, atelectasis versus infiltrate.      Electronically Signed    By: JSandi MariscalM.D.    On: 02/01/2014 09:26     Verified By: JAileen Fass M.D.,  [place]    10-Jul-15 09:04, [place] Flow Doppler Lower Extrem Right (Leg)  [place] Flow Doppler Lower Extrem Right (Leg)   REASON FOR EXAM:    Edema  COMMENTS:       PROCEDURE: [place] - [place]PL[place] LOW EXTR RIGHT  - Feb 01 2014  9:04AM     CLINICAL DATA:  Right leg pain and swelling    EXAM:  Right LOWER EXTREMITY VENOUS DOPPLER ULTRASOUND    TECHNIQUE:  Gray-scale sonography with graded compression, as well as color  Doppler and duplex ultrasound were performed to evaluate the lower  extremity deep venous systems from the level of the  common femoral  vein and including the common femoral, femoral, profunda femoral,  popliteal and calf veins including the posterior tibial, peroneal  and gastrocnemius veins when visible. The superficial great  saphenous vein was also interrogated. Spectral Doppler was utilized  to evaluate flow at rest and with distal augmentation maneuvers in  the common femoral, femoral and popliteal veins.    COMPARISON:  None.    FINDINGS:  Common Femoral Vein: No evidence of thrombus. Normal  compressibility, respiratory phasicity and response to augmentation.    Saphenofemoral Junction: No evidence of thrombus. Normal  compressibility and flow on color Doppler imaging.  Profunda Femoral Vein: No evidence of thrombus. Normal  compressibility and flow on color Doppler imaging.    Femoral Vein: No evidence of thrombus. Normal compressibility,  respiratory phasicity and response to augmentation.    Popliteal Vein:  No evidence of thrombus. Normal compressibility,  respiratory phasicity and response to augmentation.    Calf Veins: No evidence of thrombus. Normal compressibility and flow  on color Doppler imaging.    Superficial Great Saphenous Vein: No evidence of thrombus. Normal  compressibility and flow on color Doppler imaging.  Venous Reflux:  None.    Other Findings:  Diffuse lower extremity edema is noted.     IMPRESSION:  Diffuse lower extremity edema noted. No evidence of deep venous  thrombosis in the right leg.      Electronically Signed    By: Inez Catalina M.D.    On: 02/01/2014 09:05         Verified By: Everlene Farrier, M.D.,    10-Jul-15 11:32, US Kidney Bilateral  US Kidney Bilateral   REASON FOR EXAM:    acute renal failure  COMMENTS:       PROCEDURE: [place]  - US KIDNEY  - Feb 01 2014 11:32AM     CLINICAL DATA:  Acute renal failure    EXAM:  RENAL/URINARY TRACT ULTRASOUND COMPLETE    COMPARISON:  CT abdomen and pelvis February 08, 2007    FINDINGS:  Right Kidney:  Length: 8.4 cm. Right kidney is echogenic with renal cortical  thinning. There is a cyst in the upper pole of the right kidney  measuring 3.1 x 3.1 x 2.9 cm. There is a cyst in the medial right  kidney measuring 2.0 x 1.7 cm. A cyst also in the mid right kidney  measures 1.0 x 0.8 cm. No noncystic renal masses are identified on  the right. There is no pelvicaliectasis or perinephric fluid  collection on the right. There is no sonographically demonstrable  calculus or ureterectasis.    Left Kidney:    Length: 9.4 cm. Left kidney shows renal cortical thinning but  echogenicity within normal limits. There is a cyst arising from the  upper pole the left kidney measuring 5.5 x 4.0 x 4.9 cm. Multiple  smaller cysts are noted throughout the left kidney. No noncystic  renal masses are identified on the left. There is no  pelvicaliectasis or perinephric fluid collection on the left. There  is no sonographically  demonstrable calculus or ureterectasis.    Bladder:    Appears normal for degree of bladder distention.     IMPRESSION:  Kidneys are small with renal cortical thinning. Right kidney is  echogenic. Echogenicity of the left kidney is normal. These findings  are felt to represent medical renal disease. Multiple renal cysts  may be seen with chronic renal failure. There is no obstructing  focus or noncystic renal  mass on either side.  Electronically Signed    By: Lowella Grip M.D.    On: 02/01/2014 11:37         Verified By: Leafy Kindle. Jasmine December, M.D.,  Cardiology:    10-Jul-15 08:29, ED ECG  Ventricular Rate 69  Atrial Rate 300  QRS Duration 86  QT 428  QTc 458  R Axis -4  T Axis 169  ECG interpretation   Atrial fibrillation with premature ventricular or aberrantly conducted complexes  Low voltage QRS  Cannot rule out Anterior infarct (cited on or before 13-May-2013)  Abnormal ECG  When compared with ECG of 13-May-2013 17:04,  No significant change was found  ----------unconfirmed----------  Confirmed by OVERREAD, NOT ([id number]), editor PEARSON, BARBARA ([id number]) on 02/04/2014 8:19:54 AM  ED ECG     10-Jul-15 13:23, Echo Doppler  Echo Doppler   REASON FOR EXAM:      COMMENTS:       PROCEDURE: [place] - ECHO DOPPLER COMPLETE(TRANSTHOR)  - Feb 01 2014  1:23PM     RESULT: Echocardiogram Report    Patient Name:   Dennis Rice Date of Exam: 02/01/2014  Medical Rec #:  [id number]          Custom1:  Date of Birth:  [birth year]       Height:       70.0 in  Patient Age:    81 years        Weight:       230.0 lb  Patient Gender: M               BSA:          2.22 m??    Indications: CHF  Sonographer:    Janalee Dane RCS  Referring Phys: Gladstone Lighter    Summary:   1. Left ventricular ejection fraction, by visual estimation, is 30 to   35%.   2. Impaired relaxation pattern of LV diastolic filling.   3. Severe concentric left ventricular hypertrophy.   4. Severely increased  left ventricular septal thickness.   5. Moderately reduced RV systolic function.   6. Moderately dilated left atrium.   7. Mildly dilated right atrium.   8. Mild mitral valve regurgitation.   9. Mild aortic regurgitation.  10. Mild aortic valve sclerosis without stenosis.  11. Moderate tricuspid regurgitation.  12. Moderately elevated pulmonary artery systolic pressure.  13. Severely increased left ventricular posterior wall thickness.  2D AND M-MODE MEASUREMENTS (normal ranges within parentheses):  Left Ventricle:      Normal  IVSd (2D):      1.90 cm (0.7-1.1)  LVPWd (2D):     2.01 cm (0.7-1.1) Aorta/LA:                  Normal  LVIDd (2D):     3.79 cm (3.4-5.7) Aortic Root (2D): 3.90 cm (2.4-3.7)  LVIDs (2D):     3.25 cm           Left Atrium (2D): 5.00 cm (1.9-4.0)  LV FS (2D):     14.2 %   (>25%)  LV EF (2D):     30.9 %   (>50%)                                    Right Ventricle:  RVd (2D):        3.62 cm  SPECTRAL DOPPLER ANALYSIS (where applicable):  Tricuspid Valve and PA/RV Systolic Pressure: TR Max Velocity: 3.52 m/s RA   Pressure: 15 mmHg RVSP/PASP: 64.6 mmHg  PHYSICIAN INTERPRETATION:  Left Ventricle: The left ventricular internal cavity size was normal. LV   septal wall thickness was severely increased. LV posterior wall thickness   was severely increased. Severe concentric left ventricular hypertrophy.   Left ventricular ejection fraction, by visual estimation, is 30 to 35%.   Spectral Doppler shows impaired relaxation pattern of LV diastolic   filling.  Right Ventricle: The right ventricular size is normal. Global RV systolic   function is moderately reduced.  Left Atrium: The left atrium is moderately dilated.  Right Atrium: The right atrium is mildly dilated.  Pericardium: There is no evidence of pericardial effusion.  Mitral Valve: The mitral valve is normal in structure. Mild mitral valve   regurgitation is seen.  Tricuspid  Valve: The tricuspid valve is normal. Moderate tricuspid     regurgitation is visualized. The tricuspid regurgitant velocity is 3.52   m/s, and with an assumed right atrial pressure of 15 mmHg, the estimated   right ventricular systolic pressure is moderately elevated at 64.6 mmHg.  Aortic Valve: The aortic valve is normal. Mild aortic valve sclerosis is   present, with no evidence of aortic valve stenosis. Mild aortic valve   regurgitation is seen.  Pulmonic Valve: The pulmonic valve is normal.    Olympia Fields MD  Electronically signed by Como MD  Signature Date/Time: 02/02/2014/1:09:29 PM    *** Final ***    IMPRESSION: .    Verified By: Yolonda Kida, M.D., MD  CT:    10-Jul-15 15:29, CT Chest Without Contrast  CT Chest Without Contrast   REASON FOR EXAM:    chf, atelectasis  COMMENTS:       PROCEDURE: CT  - CT CHEST WITHOUT CONTRAST  - Feb 01 2014  3:29PM     CLINICAL DATA:  Shortness of breath and cough.    EXAM:  CT CHEST WITHOUT CONTRAST    TECHNIQUE:  Multidetector CT imaging of the chest was performed following the  standard protocol without IV contrast..    COMPARISON:  02/01/2014 and chest CT 06/02/2010  FINDINGS:  There is diffuse subcutaneous edema. The heart is at least mildly  enlarged with a small pericardial effusion. There are small  bilateral pleural effusions. The coronary arteries are heavily  calcified. There does not appear to be significant chest  lymphadenopathy but difficult to evaluate on this non-contrast  examination. Images of the upper abdomen demonstrate perihepatic and  perisplenic ascites and difficult to exclude cirrhosis. Again noted  is a low-density structure along the right kidney upper pole  suggestive for a renal cyst.    The trachea and mainstem bronchi are patent. There are patchy  parenchymal densities in the medial right upper lobe. There is a  linear or tubular shaped density in the right  middle lobe on  sequence 3, image 32. Volume loss in the right lower lobe with  patchy parenchymal densities. Pleural-based density in the right  middle lobe. There is volume loss in the left lower lobe with  interstitial densities. Streaky parenchymal densities in the  lingula.    Multilevel degenerative changes in the thoracic spine.     IMPRESSION:  Scattered areas of parenchymal lung disease, most prominent in  medial right upper lobe. Findings  are suggestive for multi-focal  pneumonia.    Evidence for anasarca demonstrated by subcutaneous edema, small  pleural effusions, small pericardial effusion and abdominal ascites.  Difficult to exclude underlying liver disease.  Coronary arteries are heavily calcified.      Electronically Signed    By: Markus Daft M.D.    On: 02/01/2014 16:01         Verified By: Burman Riis, M.D.,   Assessment/Plan:  Assessment/Plan:  Assessment IMP Swelling Cardiomyopathy Weakness CHF Edema AFIB Bradycardia HTN CM Demand ischemia CRI Elevated troponins .   Plan PLAN Consider home health to assist with care at home  Elevate legs Consider support stockings Tele Diuretics Bp control Support stocking Agree wiith PT/OT ECHO with mod severeCM I do not rec cath Continue conservative CHF therapy with meds Continue Eliquis/Atenolol for AFIB F/U renal insuff with nephology Medical therapy for edema and heart failure F/U in the office   Electronic Signatures: Lujean Amel D (MD)  (Signed 19-Jul-15 21:06)  Authored: Chief Complaint, VITAL SIGNS/ANCILLARY NOTES, Brief Assessment, Lab Results, Radiology Results, Assessment/Plan   Last Updated: 19-Jul-15 21:06 by Lujean Amel D (MD)

## 2014-11-16 NOTE — Discharge Summary (Signed)
PATIENT NAME:  Dennis Rice, Dennis Rice MR#:  [id number] DATE OF BIRTH:  [birth year]  DATE OF ADMISSION:  02/01/2014 DATE OF DISCHARGE:  02/04/2014  ADMITTING DIAGNOSIS: Shortness of breath.   DISCHARGE DIAGNOSES: 1.  Acute respiratory failure due to acute on chronic systolic congestive heart failure exacerbation.  2.  Atrial fibrillation, felt to be new onset, with rapid ventricular response.  3.  Acute renal failure on chronic kidney disease.  4.  Hypertension.  5.  Coronary artery disease with diffuse coronary artery disease in 2012. Medical management recommended.  6.  Gastroesophageal reflux disease.   CONSULTANTS: Dr. Anthonette Legato, Dr. Clayborn Bigness.  PERTINENT LABS AND EVALUATIONS: Admitting glucose 85, BUN 29, creatinine 2.48, sodium 142, potassium 4.5, chloride 110, CO2 24, calcium 8.9. Lipase 156. LFTs showed a total protein of 6.9, albumin 3.3, bili total 1.5, alk phos 135, AST 40, ALT 40. Troponin 0.19 then 0.21 and 0.23. WBC 5.8, hemoglobin 11.9, and platelet count was 161,000.   EKG showed A-fib with PVCs.  Echocardiogram showed EF of 30% to 35%, severe concentric LVH, moderately dilated left atrium, moderate tricuspid regurg, moderately elevated pulmonary artery systolic pressure, severely increased left ventricular posterior wall thickness.   The patient's creatinine was 2.14 on July 13th.  HOSPITAL COURSE: Please refer to H and P done by the admitting physician. The patient is an 81 year old African American male with a past medical history significant for coronary artery disease not amendable to intervention who is medically managed who presented to the ED with shortness of breath. The patient in the ED was noted to have CHF on evaluation. He was admitted and started on treatment with Lasix. The patient was also seen in consultation by cardiology and nephrology. His renal function was elevated on presentation, however, with diuresis, his renal function also improved, coming close to his  baseline. At this time, he is doing much better and appears to be compensated and is stable for discharge. Discharge instructions for CHF given.    DISCHARGE MEDICATIONS: Norvasc 5 mg daily, Prilosec 40 daily, simvastatin 20 daily, isosorbide mononitrate 60 daily, cimetidine 200 two tabs b.i.d., alprazolam 0.25 one tab p.o. b.i.d. as needed, atenolol 25 daily, apixaban 2.5 mg 1 tab p.o. b.i.d., aspirin 81 mg 1 tab p.o. daily, metoprolol 25 one tab p.o. b.i.d., Digitek 125 mcg daily, Lasix 40 one tab p.o. b.i.d.   DISCHARGE DIET: Low-sodium, low-fat, low-cholesterol.   DISCHARGE ACTIVITY: As tolerated.   DISCHARGE FOLLOWUP: Follow with Dr. Clayborn Bigness in 1 to 2 weeks. Follow with Dr. Anthonette Legato in 1 to 2 weeks. Follow with primary MD in 1 to 2 weeks. The patient is to have BMP checked at time of visit with Dr. Anthonette Legato.   TIME SPENT: 35 minutes.   ____________________________ Lafonda Mosses Posey Pronto, MD shp:sb D: 02/05/2014 08:14:57 ET T: 02/05/2014 08:37:00 ET JOB#: [id number]  cc: Isiah Scheel H. Posey Pronto, MD, <Dictator> Alric Seton MD ELECTRONICALLY SIGNED 02/10/2014 9:45

## 2014-11-17 NOTE — Op Note (Signed)
PATIENT NAME:  Dennis LegatoBURTON, Camry L MR#:  [id number] DATE OF BIRTH:  30-May-1924  DATE OF PROCEDURE:  08/16/2011  PREOPERATIVE DIAGNOSES:  1. Benign prostatic hypertrophy with bladder outlet obstruction.  2. Urinary retention.   POSTOPERATIVE DIAGNOSES:  1. Benign prostatic hypertrophy with bladder outlet obstruction.  2. Urinary retention.  3. Bladder stone.   PROCEDURES:  1. Photovaporization of the prostate with green light laser. 2. Litholapaxy of bladder stone with the holmium laser.   SURGEON: Suszanne ConnersMichael R. Evelene CroonWolff, MD   ANESTHETIST: Dr. Darleene CleaverVan Staveren    ANESTHETIC METHOD: General.   INDICATIONS: See the dictated history and physical. After informed consent, the patient requested the above procedure.   OPERATIVE SUMMARY: After adequate general anesthesia had been attained, the patient was placed into dorsal lithotomy position and the perineum was prepped and draped in the usual fashion. Laser scope was coupled with the camera and then visually advanced into the bladder. Bladder was heavily trabeculated. No bladder tumors were identified. Both ureteral orifices were identified and had clear efflux. The patient had trilobar benign prostatic hypertrophy with visual obstruction. The patient had a 10 mm bladder stone present. At this point, the 500 micron holmium laser fiber was introduced through the scope and the stone was disintegrated. Fragments were evacuated from the bladder. The XPS green light laser fiber was then introduced through the scope and vaporization of the prostate was begun at the bladder neck at a setting of 80 watts. After bladder neck resection was complete, power was turned up to 120 watts and the middle portion of the prostate was vaporized to the level of the verumontanum. The power was then increased to 180 watts and remaining obstructive tissue was vaporized. At this point, the scope was removed and a 20 [place] Foley catheter was placed. The catheter was irrigated until clear.  A B and O suppository was placed. The procedure was then terminated and the patient was transferred to the recovery room in stable condition.   ____________________________ Suszanne ConnersMichael R. Evelene CroonWolff, MD [id number] D: 08/16/2011 13:11:29 ET T: 08/16/2011 13:28:09 ET JOB#: [id number]  cc: Suszanne ConnersMichael R. Evelene CroonWolff, MD, <Dictator> Serita ShellerErnest B. Maryellen PileEason, MD Orson ApeMICHAEL R Rayya Yagi MD ELECTRONICALLY SIGNED 08/17/2011 17:50

## 2014-11-19 ENCOUNTER — Ambulatory Visit
Admit: 2014-11-19 | Disposition: A | Discharge status: Home / Self Care | Payer: Self-pay | Attending: Family | Admitting: Family

## 2014-11-25 ENCOUNTER — Ambulatory Visit: Payer: Self-pay | Admitting: Family

## 2015-02-18 ENCOUNTER — Ambulatory Visit: Payer: Self-pay | Admitting: Family

## 2015-09-21 ENCOUNTER — Encounter: Payer: Self-pay | Admitting: Emergency Medicine

## 2015-09-21 ENCOUNTER — Emergency Department: Payer: Medicare Other

## 2015-09-21 ENCOUNTER — Inpatient Hospital Stay
Admission: EM | Admit: 2015-09-21 | Discharge: 2015-09-26 | DRG: 682 | Disposition: A | Discharge status: Skilled Nursing Facility | Payer: Medicare Other | Attending: Internal Medicine | Admitting: Internal Medicine

## 2015-09-21 DIAGNOSIS — N261 Atrophy of kidney (terminal): Secondary | ICD-10-CM | POA: Diagnosis present

## 2015-09-21 DIAGNOSIS — Z9989 Dependence on other enabling machines and devices: Secondary | ICD-10-CM

## 2015-09-21 DIAGNOSIS — F419 Anxiety disorder, unspecified: Secondary | ICD-10-CM | POA: Diagnosis present

## 2015-09-21 DIAGNOSIS — N189 Chronic kidney disease, unspecified: Secondary | ICD-10-CM

## 2015-09-21 DIAGNOSIS — N179 Acute kidney failure, unspecified: Secondary | ICD-10-CM | POA: Diagnosis present

## 2015-09-21 DIAGNOSIS — E039 Hypothyroidism, unspecified: Secondary | ICD-10-CM | POA: Diagnosis present

## 2015-09-21 DIAGNOSIS — Z7982 Long term (current) use of aspirin: Secondary | ICD-10-CM | POA: Diagnosis not present

## 2015-09-21 DIAGNOSIS — N184 Chronic kidney disease, stage 4 (severe): Secondary | ICD-10-CM | POA: Diagnosis present

## 2015-09-21 DIAGNOSIS — I130 Hypertensive heart and chronic kidney disease with heart failure and stage 1 through stage 4 chronic kidney disease, or unspecified chronic kidney disease: Secondary | ICD-10-CM | POA: Diagnosis present

## 2015-09-21 DIAGNOSIS — I739 Peripheral vascular disease, unspecified: Secondary | ICD-10-CM | POA: Diagnosis present

## 2015-09-21 DIAGNOSIS — K5909 Other constipation: Secondary | ICD-10-CM | POA: Diagnosis present

## 2015-09-21 DIAGNOSIS — M549 Dorsalgia, unspecified: Secondary | ICD-10-CM | POA: Diagnosis present

## 2015-09-21 DIAGNOSIS — N400 Enlarged prostate without lower urinary tract symptoms: Secondary | ICD-10-CM | POA: Diagnosis present

## 2015-09-21 DIAGNOSIS — G4733 Obstructive sleep apnea (adult) (pediatric): Secondary | ICD-10-CM | POA: Diagnosis present

## 2015-09-21 DIAGNOSIS — N401 Enlarged prostate with lower urinary tract symptoms: Secondary | ICD-10-CM | POA: Diagnosis present

## 2015-09-21 DIAGNOSIS — I5023 Acute on chronic systolic (congestive) heart failure: Secondary | ICD-10-CM | POA: Diagnosis present

## 2015-09-21 DIAGNOSIS — R339 Retention of urine, unspecified: Secondary | ICD-10-CM | POA: Diagnosis present

## 2015-09-21 DIAGNOSIS — G8929 Other chronic pain: Secondary | ICD-10-CM | POA: Diagnosis present

## 2015-09-21 DIAGNOSIS — R34 Anuria and oliguria: Secondary | ICD-10-CM | POA: Diagnosis present

## 2015-09-21 DIAGNOSIS — E785 Hyperlipidemia, unspecified: Secondary | ICD-10-CM | POA: Diagnosis present

## 2015-09-21 DIAGNOSIS — I10 Essential (primary) hypertension: Secondary | ICD-10-CM | POA: Diagnosis present

## 2015-09-21 DIAGNOSIS — N289 Disorder of kidney and ureter, unspecified: Secondary | ICD-10-CM

## 2015-09-21 DIAGNOSIS — D631 Anemia in chronic kidney disease: Secondary | ICD-10-CM | POA: Diagnosis present

## 2015-09-21 DIAGNOSIS — R531 Weakness: Secondary | ICD-10-CM

## 2015-09-21 DIAGNOSIS — Z79899 Other long term (current) drug therapy: Secondary | ICD-10-CM | POA: Diagnosis not present

## 2015-09-21 DIAGNOSIS — I2510 Atherosclerotic heart disease of native coronary artery without angina pectoris: Secondary | ICD-10-CM | POA: Diagnosis present

## 2015-09-21 DIAGNOSIS — K219 Gastro-esophageal reflux disease without esophagitis: Secondary | ICD-10-CM | POA: Diagnosis present

## 2015-09-21 DIAGNOSIS — R748 Abnormal levels of other serum enzymes: Secondary | ICD-10-CM | POA: Diagnosis present

## 2015-09-21 LAB — COMPREHENSIVE METABOLIC PANEL
ALK PHOS: 155 U/L — AB (ref 38–126)
ALT: 18 U/L (ref 17–63)
AST: 40 U/L (ref 15–41)
Albumin: 3.5 g/dL (ref 3.5–5.0)
Anion gap: 12 (ref 5–15)
BUN: 74 mg/dL — AB (ref 6–20)
CALCIUM: 9.4 mg/dL (ref 8.9–10.3)
CO2: 21 mmol/L — AB (ref 22–32)
CREATININE: 4.11 mg/dL — AB (ref 0.61–1.24)
Chloride: 103 mmol/L (ref 101–111)
GFR calc non Af Amer: 12 mL/min — ABNORMAL LOW (ref >60)
GFR, EST AFRICAN AMERICAN: 14 mL/min — AB (ref >60)
GLUCOSE: 112 mg/dL — AB (ref 65–99)
Potassium: 4 mmol/L (ref 3.5–5.1)
SODIUM: 136 mmol/L (ref 135–145)
Total Bilirubin: 1.5 mg/dL — ABNORMAL HIGH (ref 0.3–1.2)
Total Protein: 6.8 g/dL (ref 6.5–8.1)

## 2015-09-21 LAB — CBC
HCT: 38.2 % — ABNORMAL LOW (ref 40.0–52.0)
HEMOGLOBIN: 12 g/dL — AB (ref 13.0–18.0)
MCH: 25.2 pg — AB (ref 26.0–34.0)
MCHC: 31.5 g/dL — ABNORMAL LOW (ref 32.0–36.0)
MCV: 80.1 fL (ref 80.0–100.0)
Platelets: 180 10*3/uL (ref 150–440)
RBC: 4.77 MIL/uL (ref 4.40–5.90)
RDW: 19.8 % — ABNORMAL HIGH (ref 11.5–14.5)
WBC: 5.4 10*3/uL (ref 3.8–10.6)

## 2015-09-21 LAB — TROPONIN I: Troponin I: 0.22 ng/mL — ABNORMAL HIGH (ref <0.031)

## 2015-09-21 LAB — BRAIN NATRIURETIC PEPTIDE: B Natriuretic Peptide: 3636 pg/mL — ABNORMAL HIGH (ref 0.0–100.0)

## 2015-09-21 MED ORDER — SODIUM CHLORIDE 0.9% FLUSH
3.0000 mL | Freq: Two times a day (BID) | INTRAVENOUS | Status: DC
  Administered 2015-09-21 – 2015-09-24 (×6): 3 mL via INTRAVENOUS

## 2015-09-21 MED ORDER — PANTOPRAZOLE SODIUM 40 MG PO TBEC
40.0000 mg | DELAYED_RELEASE_TABLET | Freq: Every day | ORAL | Status: DC
  Administered 2015-09-22 – 2015-09-26 (×5): 40 mg via ORAL
  Filled 2015-09-21 (×5): qty 1

## 2015-09-21 MED ORDER — ASPIRIN 81 MG PO CHEW
81.0000 mg | CHEWABLE_TABLET | Freq: Every day | ORAL | Status: DC
  Administered 2015-09-22 – 2015-09-26 (×5): 81 mg via ORAL
  Filled 2015-09-21 (×5): qty 1

## 2015-09-21 MED ORDER — APIXABAN 2.5 MG PO TABS
2.5000 mg | ORAL_TABLET | Freq: Two times a day (BID) | ORAL | Status: DC
  Administered 2015-09-21 – 2015-09-26 (×10): 2.5 mg via ORAL
  Filled 2015-09-21 (×10): qty 1

## 2015-09-21 MED ORDER — ONDANSETRON HCL 4 MG/2ML IJ SOLN: 4.0000 mg | Freq: Four times a day (QID) | INTRAMUSCULAR | Status: DC | PRN

## 2015-09-21 MED ORDER — ENALAPRIL MALEATE 5 MG PO TABS
2.5000 mg | ORAL_TABLET | Freq: Every day | ORAL | Status: DC
Start: 2015-09-22 — End: 2015-09-22
  Filled 2015-09-21: qty 1

## 2015-09-21 MED ORDER — SIMVASTATIN 20 MG PO TABS
20.0000 mg | ORAL_TABLET | Freq: Every day | ORAL | Status: DC
  Administered 2015-09-21 – 2015-09-25 (×5): 20 mg via ORAL
  Filled 2015-09-21 (×5): qty 1

## 2015-09-21 MED ORDER — ACETAMINOPHEN 650 MG RE SUPP: 650.0000 mg | Freq: Four times a day (QID) | RECTAL | Status: DC | PRN

## 2015-09-21 MED ORDER — ALBUTEROL SULFATE (2.5 MG/3ML) 0.083% IN NEBU
5.0000 mg | INHALATION_SOLUTION | Freq: Once | RESPIRATORY_TRACT | Status: AC
  Administered 2015-09-21: 5 mg via RESPIRATORY_TRACT
  Filled 2015-09-21: qty 6

## 2015-09-21 MED ORDER — ONDANSETRON HCL 4 MG PO TABS: 4.0000 mg | ORAL_TABLET | Freq: Four times a day (QID) | ORAL | Status: DC | PRN

## 2015-09-21 MED ORDER — ACETAMINOPHEN 325 MG PO TABS: 650.0000 mg | ORAL_TABLET | Freq: Four times a day (QID) | ORAL | Status: DC | PRN

## 2015-09-21 NOTE — ED Provider Notes (Signed)
[facility] Emergency Department Provider Note  Time seen: 5:44 PM  I have reviewed the triage vital signs and the nursing notes.   HISTORY  Chief Complaint Shortness of Breath    HPI Dennis Rice is a 82 y.o. male with a past medical history of hypertension, hyperlipidemia, CHF presents to the emergency department with increased shortness of breath decreased urination, no increased lower extremity edema. According to EMS per family report over the past few days the patient has become more short of breath but is occasional cough, also increased bilateral lower extremity edema. The family states the patient has a history of CHF. Family also is concerned that the patient has not been producing much urine over the past few days and they believe his kidney function is poor at baseline. Family is not currently at the emergency department for further history. Patient is a poor historian.     Past Medical History  Diagnosis Date  . GERD (gastroesophageal reflux disease)   . PAD (peripheral artery disease) (HCC)   . Hypertension   . Coronary artery disease   . Back pain     Chronic  . BPH (benign prostatic hypertrophy)   . Kidney stones   . Hypothyroidism   . Hyperlipidemia   . OSA on CPAP     Patient Active Problem List   Diagnosis Date Noted  . Chronic systolic heart failure (HCC) 10/26/2014  . GERD (gastroesophageal reflux disease) 10/26/2014    Past Surgical History  Procedure Laterality Date  . Cardiac catheterization    . Cataract extraction    . Lithotripsy      Current Outpatient Rx  Name  Route  Sig  Dispense  Refill  . apixaban (ELIQUIS) 2.5 MG TABS tablet   Oral   Take 2.5 mg by mouth 2 (two) times daily.         Marland Kitchen aspirin 81 MG chewable tablet   Oral   Chew 81 mg by mouth daily.         . cimetidine (TAGAMET) 200 MG tablet   Oral   Take 200 mg by mouth 2 (two) times daily.         . enalapril (VASOTEC) 2.5 MG tablet    Oral   Take 2.5 mg by mouth daily.         . isosorbide-hydrALAZINE (BIDIL) 20-37.5 MG per tablet   Oral   Take 1 tablet by mouth 3 (three) times daily.         . metoprolol tartrate (LOPRESSOR) 25 MG tablet   Oral   Take 25 mg by mouth 2 (two) times daily. Pt is to take 0.5 tabs twice daily         . pantoprazole (PROTONIX) 40 MG tablet   Oral   Take 40 mg by mouth daily.         . simvastatin (ZOCOR) 20 MG tablet   Oral   Take 20 mg by mouth at bedtime.           Allergies Review of patient's allergies indicates no known allergies.  History reviewed. No pertinent family history.  Social History Social History  Substance Use Topics  . Smoking status: Never Smoker   . Smokeless tobacco: Never Used  . Alcohol Use: No    Review of Systems Constitutional: Negative for fever. Cardiovascular: Negative for chest pain. Respiratory: Positive for shortness of breath Gastrointestinal: Negative for abdominal pain Genitourinary: Negative for dysuria.  Musculoskeletal: Lower show  me edema Neurological: Negative for headache 10-point ROS otherwise negative.  ____________________________________________   PHYSICAL EXAM:  VITAL SIGNS: ED Triage Vitals  Enc Vitals Group     BP 09/21/15 1720 112/69 mmHg     Pulse Rate 09/21/15 1720 58     Resp 09/21/15 1720 20     Temp 09/21/15 1720 98.4 F (36.9 C)     Temp Source 09/21/15 1720 Oral     SpO2 09/21/15 1720 97 %     Weight 09/21/15 1720 195 lb (88.451 kg)     Height 09/21/15 1720  (1.753 m)     Head Cir --      Peak Flow --      Pain Score --      Pain Loc --      Pain Edu? --      Excl. in GC? --     Constitutional: Alert and oriented. Well appearing and in no distress. Eyes: Normal exam ENT   Head: Normocephalic and atraumatic.   Mouth/Throat: Mucous membranes are moist. Cardiovascular: Normal rate, regular rhythm. No murmur Respiratory: Normal respiratory effort without tachypnea nor  retractions. Breath sounds are clear Gastrointestinal: Soft and nontender. No distention.   Musculoskeletal: Nontender with normal range of motion in all extremities. 2+ pitting edema bilaterally Neurologic:  Normal speech and language. No gross focal neurologic deficits Skin:  Skin is warm, dry and intact.  Psychiatric: Mood and affect are normal. Speech and behavior are normal.  ____________________________________________    EKG  EKG reviewed and interpreted by myself shows atrial fibrillation at 68 bpm, narrow QRS, normal axis, low voltage, nonspecific ST changes.  ____________________________________________    RADIOLOGY  Atelectasis versus pneumonia  ____________________________________________    INITIAL IMPRESSION / ASSESSMENT AND PLAN / ED COURSE  Pertinent labs & imaging results that were available during my care of the patient were reviewed by me and considered in my medical decision making (see chart for details).  Patient presents for increasing shortness of breath, lower extremity edema, and decreased urine production per family. EMS report. Patient appears well, no distress. We will check labs, chest x-ray, EKG, and monitor closely in the emergency department.  Labs show troponin elevation which appears to be close to the patient's baseline. Patient's creatinine is noted to be 4.1 today elevated from his baseline around 2.0. Given his symptoms of increased fatigue, trouble breathing and decreased urine production this is likely due to his acute on chronic renal insufficiency. Patient will be admitted to the hospital for evaluation. IV fluids have been ordered for the patient in the emergency department.  Chest x-ray does show basilar atelectasis versus infiltrate, no signs of cough or fever, highly suspect atelectasis over pneumonia.  ____________________________________________   FINAL CLINICAL IMPRESSION(S) / ED DIAGNOSES  Acute on chronic renal  insufficiency   Minna Antis, MD 09/21/15 2113

## 2015-09-21 NOTE — ED Notes (Signed)
Patient transported to X-ray 

## 2015-09-21 NOTE — H&P (Addendum)
[facility] [place] [facility]   PATIENT NAME: Dennis Rice    MR#:  [id number]  DATE OF BIRTH:  18-Oct-1923  DATE OF ADMISSION:  09/21/2015  PRIMARY CARE PHYSICIAN: Derwood Kaplan, MD   REQUESTING/REFERRING PHYSICIAN: Lenard Lance, MD  CHIEF COMPLAINT:   Chief Complaint  Patient presents with  . Shortness of Breath    HISTORY OF PRESENT ILLNESS:  Dennis Rice  is a 82 y.o. male who presents with increased shortness of breath, lower extremity edema, and intermittent confusion. Patient is able to provide some details of his history, though unable to answer all questions, most notably including CODE STATUS. Collateral history is taken from the ED physician and family, though there are not currently present for this writer's interview. Family states that brought him in because he's had an increased cough but also mostly because he is not making very much urine. Patient has a history of BPH with lower urinary tract symptoms in the past. On evaluation here lab work showed his creatinine at 4 with a GFR of 12. The only prior value we have in our system is from 2015 which showed a creatinine of 2 and a GFR of 30. He is also found to be in heart failure at this time, chest x-ray showing small bilateral pleural effusions, and a BNP is significantly elevated, though it is unclear if this is far from his baseline. The rest of his workup is largely benign. Hospitalists were called for admission.  PAST MEDICAL HISTORY:   Past Medical History  Diagnosis Date  . GERD (gastroesophageal reflux disease)   . PAD (peripheral artery disease) (HCC)   . Hypertension   . Coronary artery disease   . Back pain     Chronic  . BPH (benign prostatic hypertrophy)   . Kidney stones   . Hypothyroidism   . Hyperlipidemia   . OSA on CPAP     PAST SURGICAL HISTORY:   Past Surgical History  Procedure Laterality Date  . Cardiac catheterization    . Cataract extraction    .  Lithotripsy      SOCIAL HISTORY:   Social History  Substance Use Topics  . Smoking status: Never Smoker   . Smokeless tobacco: Never Used  . Alcohol Use: No    FAMILY HISTORY:  History reviewed. No pertinent family history.   Patient is unable to provide any significant family history. DRUG ALLERGIES:  No Known Allergies  MEDICATIONS AT HOME:   Prior to Admission medications   Medication Sig Start Date End Date Taking? Authorizing Provider  apixaban (ELIQUIS) 2.5 MG TABS tablet Take 2.5 mg by mouth 2 (two) times daily.    Historical Provider, MD  aspirin 81 MG chewable tablet Chew 81 mg by mouth daily.    Historical Provider, MD  cimetidine (TAGAMET) 200 MG tablet Take 200 mg by mouth 2 (two) times daily.    Historical Provider, MD  enalapril (VASOTEC) 2.5 MG tablet Take 2.5 mg by mouth daily.    Historical Provider, MD  isosorbide-hydrALAZINE (BIDIL) 20-37.5 MG per tablet Take 1 tablet by mouth 3 (three) times daily.    Historical Provider, MD  metoprolol tartrate (LOPRESSOR) 25 MG tablet Take 25 mg by mouth 2 (two) times daily. Pt is to take 0.5 tabs twice daily    Historical Provider, MD  pantoprazole (PROTONIX) 40 MG tablet Take 40 mg by mouth daily.    Historical Provider, MD  simvastatin (ZOCOR) 20 MG tablet Take  20 mg by mouth at bedtime.    Historical Provider, MD    REVIEW OF SYSTEMS:  Review of Systems  Constitutional: Negative for fever, chills, weight loss and malaise/fatigue.  HENT: Negative for ear pain, hearing loss and tinnitus.   Eyes: Negative for blurred vision, double vision, pain and redness.  Respiratory: Positive for shortness of breath. Negative for cough and hemoptysis.   Cardiovascular: Positive for leg swelling. Negative for chest pain, palpitations and orthopnea.  Gastrointestinal: Negative for nausea, vomiting, abdominal pain, diarrhea and constipation.  Genitourinary: Negative for dysuria, frequency and hematuria.       Oliguria   Musculoskeletal: Negative for back pain, joint pain and neck pain.  Skin:       No acne, rash, or lesions  Neurological: Negative for dizziness, tremors, focal weakness and weakness.       Intermittent confusion  Endo/Heme/Allergies: Negative for polydipsia. Does not bruise/bleed easily.  Psychiatric/Behavioral: Negative for depression. The patient is not nervous/anxious and does not have insomnia.      VITAL SIGNS:   Filed Vitals:   09/21/15 1815 09/21/15 1830 09/21/15 1930 09/21/15 1941  BP:  113/63 124/72 124/72  Pulse: 50   53  Temp:      TempSrc:      Resp:  18  18  Height:      Weight:      SpO2: 95%   99%   Wt Readings from Last 3 Encounters:  09/21/15 88.451 kg (195 lb)  08/27/14 80.287 kg (177 lb)    PHYSICAL EXAMINATION:  Physical Exam  Vitals reviewed. Constitutional: He appears well-developed and well-nourished. No distress.  HENT:  Head: Normocephalic and atraumatic.  Mouth/Throat: Oropharynx is clear and moist.  Eyes: Conjunctivae and EOM are normal. Pupils are equal, round, and reactive to light. No scleral icterus.  Neck: Normal range of motion. Neck supple. No JVD present. No thyromegaly present.  Cardiovascular: Normal rate and intact distal pulses.  Exam reveals no gallop and no friction rub.   No murmur heard. Irregular  Respiratory: Effort normal and breath sounds normal. No respiratory distress. He has no wheezes. He has no rales.  GI: Soft. Bowel sounds are normal. He exhibits distension. There is no tenderness.  Musculoskeletal: Normal range of motion. He exhibits edema (3+ bilateral lower extremity).  No arthritis, no gout  Lymphadenopathy:    He has no cervical adenopathy.  Neurological: He is alert. No cranial nerve deficit.  Oriented 2. Not oriented to circumstance. No dysarthria, no aphasia  Skin: Skin is warm and dry. No rash noted. No erythema.  Psychiatric:  Unable to fully assess due to patient's intermittent confusion     LABORATORY PANEL:   CBC  Recent Labs Lab 09/21/15 1854  WBC 5.4  HGB 12.0*  HCT 38.2*  PLT 180   ------------------------------------------------------------------------------------------------------------------  Chemistries   Recent Labs Lab 09/21/15 1854  NA 136  K 4.0  CL 103  CO2 21*  GLUCOSE 112*  BUN 74*  CREATININE 4.11*  CALCIUM 9.4  AST 40  ALT 18  ALKPHOS 155*  BILITOT 1.5*   ------------------------------------------------------------------------------------------------------------------  Cardiac Enzymes  Recent Labs Lab 09/21/15 1854  TROPONINI 0.22*   ------------------------------------------------------------------------------------------------------------------  RADIOLOGY:  Dg Chest 2 View  09/21/2015  CLINICAL DATA:  Shortness of breath. Bilateral lower extremity edema. Oliguria. EXAM: CHEST  2 VIEW COMPARISON:  Multiple exams, including 02/01/2014 FINDINGS: Indistinct airspace opacities at the lung bases with bandlike density in the left mid lung. Indistinct posterior costophrenic angles.  Moderate enlargement of the cardiopericardial silhouette without edema. Dense coronary artery atherosclerotic calcification best observed on the lateral projection. Thoracic spondylosis.  Degenerative right glenohumeral arthropathy. IMPRESSION: 1. Bibasilar airspace opacities potentially from atelectasis or pneumonia. Small bilateral pleural effusions. 2. Moderate cardiomegaly. 3. Scarring in the left mid lung, possibly with adjacent bulla. Electronically Signed   By: Gaylyn Rong M.D.   On: 09/21/2015 18:22    EKG:   Orders placed or performed during the hospital encounter of 09/21/15  . ED EKG  . ED EKG  . ED EKG  . ED EKG    IMPRESSION AND PLAN:  Principal Problem:   Acute on chronic renal failure (HCC) - unclear etiology at this point, though it is possible this is an obstructive process to his BPH, as he does have a history of lower  urinary tract symptoms due to enlarged prostate in the past. We'll get a bladder scan to see if he'll start volume urine retained, and place a Foley catheter will then send urine studies. We'll also get a renal ultrasound. We'll get a nephrology consult. Active Problems:   Oliguria - secondary to his renal failure, start workup for etiology as above   Acute on chronic systolic CHF (congestive heart failure) (HCC) - fluid or shaking for now, will avoid diuretic use at the moment to avoid further nephrotoxicity as it is unclear if the patient's heart failure exacerbation is simply due to fluid retention due to his oliguria and renal issue, which is the most likely scenario at this point.   BPH (benign prostatic hyperplasia) - patient does not seem to be on any medication for this, we will start with a renal ultrasound and workup for his renal failure as above, may consider a neurology consult at some point if it becomes apparent that his prostate is playing a part in his renal failure and some obstructive process.   HTN (hypertension) - currently low side normotensive, hold antihypertensives for now.   OSA on CPAP - CPAP daily at bedtime   GERD (gastroesophageal reflux disease) - home dose PPI  All the records are reviewed and case discussed with ED provider. Management plans discussed with the patient and/or family.  DVT PROPHYLAXIS: Systemic anticoagulation  GI PROPHYLAXIS: PPI  ADMISSION STATUS: Inpatient  CODE STATUS: Full, though this should be reviewed with family once they're able to be contacted as the patient was unable to truly have this discussion given his intermittent confusion. Code Status History    This patient does not have a recorded code status. Please follow your organizational policy for patients in this situation.      TOTAL TIME TAKING CARE OF THIS PATIENT: 45 minutes.    Dennis Rice FIELDING 09/21/2015, 9:26 PM  [facility]  Office   [phone]  CC: Primary care physician; Derwood Kaplan, MD

## 2015-09-21 NOTE — ED Notes (Signed)
Pt arrived by EMS from home. with c/o of increased work of breath. Family states pt has hx of CHF but that he has become more SOB over the last couple of days and had increased Bi-lateral edema in LE. Family also told EMS that he has produced very little urin over the last couple of days and that his kidney function is "not good".

## 2015-09-22 ENCOUNTER — Inpatient Hospital Stay
Admit: 2015-09-22 | Discharge: 2015-09-22 | Disposition: A | Discharge status: Home / Self Care | Payer: Medicare Other | Attending: Cardiovascular Disease | Admitting: Cardiovascular Disease

## 2015-09-22 ENCOUNTER — Inpatient Hospital Stay: Payer: Medicare Other

## 2015-09-22 LAB — CBC
HEMATOCRIT: 35.5 % — AB (ref 40.0–52.0)
HEMOGLOBIN: 11.4 g/dL — AB (ref 13.0–18.0)
MCH: 25.2 pg — ABNORMAL LOW (ref 26.0–34.0)
MCHC: 32.1 g/dL (ref 32.0–36.0)
MCV: 78.6 fL — ABNORMAL LOW (ref 80.0–100.0)
Platelets: 169 10*3/uL (ref 150–440)
RBC: 4.51 MIL/uL (ref 4.40–5.90)
RDW: 19.7 % — AB (ref 11.5–14.5)
WBC: 4.9 10*3/uL (ref 3.8–10.6)

## 2015-09-22 LAB — BASIC METABOLIC PANEL
ANION GAP: 8 (ref 5–15)
BUN: 75 mg/dL — AB (ref 6–20)
CO2: 24 mmol/L (ref 22–32)
Calcium: 9.2 mg/dL (ref 8.9–10.3)
Chloride: 104 mmol/L (ref 101–111)
Creatinine, Ser: 4.05 mg/dL — ABNORMAL HIGH (ref 0.61–1.24)
GFR, EST AFRICAN AMERICAN: 14 mL/min — AB (ref >60)
GFR, EST NON AFRICAN AMERICAN: 12 mL/min — AB (ref >60)
Glucose, Bld: 105 mg/dL — ABNORMAL HIGH (ref 65–99)
POTASSIUM: 4.2 mmol/L (ref 3.5–5.1)
SODIUM: 136 mmol/L (ref 135–145)

## 2015-09-22 LAB — TROPONIN I
TROPONIN I: 0.21 ng/mL — AB (ref <0.031)
Troponin I: 0.22 ng/mL — ABNORMAL HIGH (ref <0.031)
Troponin I: 0.24 ng/mL — ABNORMAL HIGH (ref <0.031)

## 2015-09-22 MED ORDER — LEVOTHYROXINE SODIUM 150 MCG PO TABS
150.0000 ug | ORAL_TABLET | Freq: Every day | ORAL | Status: DC
  Administered 2015-09-23 – 2015-09-26 (×4): 150 ug via ORAL
  Filled 2015-09-22 (×5): qty 1

## 2015-09-22 NOTE — Progress Notes (Signed)
*  PRELIMINARY RESULTS* °Echocardiogram °2D Echocardiogram has been performed. ° °Dennis Rice °09/22/2015, 2:39 PM °

## 2015-09-22 NOTE — Plan of Care (Signed)
Problem: Education: Goal: Knowledge of [place] [facility] Education information/materials will improve Outcome: Progressing Handouts related to his Dx given to patient.

## 2015-09-22 NOTE — Plan of Care (Signed)
Problem: Phase I Progression Outcomes Goal: Pain controlled with appropriate interventions Outcome: Completed/Met Date Met:  09/22/15 No c/o pain on shift will continue to monitor.

## 2015-09-22 NOTE — Progress Notes (Signed)
Bladder scan patient, retaining 300 cc. Paged Dr. Anne Hahn about patient's urinary retention, communicated that patient is urinating but not measurable due to his incontinence.  New order: foley placement

## 2015-09-22 NOTE — Progress Notes (Signed)
Dennis Rice is a 82 y.o. male  [id number]  Primary Cardiologist: Adrian Blackwater  Reason for Consultation:CHF  HPI: 17 YOBM presented to [facility] with swelling of legs, SOB, and confusion. Review of Systems: NO chest pains, but has orthopnea and PND   Past Medical History  Diagnosis Date  . GERD (gastroesophageal reflux disease)   . PAD (peripheral artery disease) (HCC)   . Hypertension   . Coronary artery disease   . Back pain     Chronic  . BPH (benign prostatic hypertrophy)   . Kidney stones   . Hypothyroidism   . Hyperlipidemia   . OSA on CPAP     Medications Prior to Admission  Medication Sig Dispense Refill  . aspirin 81 MG chewable tablet Chew 81 mg by mouth daily.    . calcitRIOL (ROCALTROL) 0.25 MCG capsule Take 0.25 mcg by mouth daily.    . cimetidine (TAGAMET) 200 MG tablet Take 200 mg by mouth 2 (two) times daily.    . enalapril (VASOTEC) 2.5 MG tablet Take 2.5 mg by mouth daily.    . furosemide (LASIX) 40 MG tablet Take 40 mg by mouth 2 (two) times daily.    . isosorbide-hydrALAZINE (BIDIL) 20-37.5 MG per tablet Take 1 tablet by mouth 3 (three) times daily.    . metoprolol tartrate (LOPRESSOR) 25 MG tablet Take 12.5 mg by mouth 2 (two) times daily.     . simvastatin (ZOCOR) 20 MG tablet Take 20 mg by mouth at bedtime.    Marland Kitchen SYNTHROID 150 MCG tablet Take 150 mcg by mouth daily.       Marland Kitchen apixaban  2.5 mg Oral BID  . aspirin  81 mg Oral Daily  . enalapril  2.5 mg Oral Daily  . pantoprazole  40 mg Oral Daily  . simvastatin  20 mg Oral QHS  . sodium chloride flush  3 mL Intravenous Q12H    Infusions:    No Known Allergies  Social History   Social History  . Marital Status: Married    Spouse Name: N/A  . Number of Children: N/A  . Years of Education: N/A   Occupational History  . Not on file.   Social History Main Topics  . Smoking status: Never Smoker   . Smokeless tobacco: Never Used  . Alcohol Use: No  . Drug Use: No  . Sexual Activity: Not  on file   Other Topics Concern  . Not on file   Social History Narrative    History reviewed. No pertinent family history.  PHYSICAL EXAM: Filed Vitals:   09/22/15 0517 09/22/15 0655  BP: 97/53 101/58  Pulse: 56 97  Temp: 98.3 F (36.8 C)   Resp: 20      Intake/Output Summary (Last 24 hours) at 09/22/15 1004 Last data filed at 09/22/15 0155  Gross per 24 hour  Intake      0 ml  Output    100 ml  Net   -100 ml    General:  Well appearing. No respiratory difficulty HEENT: normal Neck: supple. no JVD. Carotids 2+ bilat; no bruits. No lymphadenopathy or thryomegaly appreciated. Cor: PMI nondisplaced. Regular rate & rhythm. No rubs, gallops or murmurs. Lungs: clear Abdomen: soft, nontender, nondistended. No hepatosplenomegaly. No bruits or masses. Good bowel sounds. Extremities: no cyanosis, clubbing, rash, edema Neuro: alert & oriented x 3, cranial nerves grossly intact. moves all 4 extremities w/o difficulty. Affect pleasant.  ECG: Afib 68/min PRWP,occasional PVC, and old ASWMI  and low voltage.  Results for orders placed or performed during the hospital encounter of 09/21/15 (from the past 24 hour(s))  CBC     Status: Abnormal   Collection Time: 09/21/15  6:54 PM  Result Value Ref Range   WBC 5.4 3.8 - 10.6 K/uL   RBC 4.77 4.40 - 5.90 MIL/uL   Hemoglobin 12.0 (L) 13.0 - 18.0 g/dL   HCT 16.1 (L) 09.6 - 04.5 %   MCV 80.1 80.0 - 100.0 fL   MCH 25.2 (L) 26.0 - 34.0 pg   MCHC 31.5 (L) 32.0 - 36.0 g/dL   RDW 40.9 (H) 81.1 - 91.4 %   Platelets 180 150 - 440 K/uL  Comprehensive metabolic panel     Status: Abnormal   Collection Time: 09/21/15  6:54 PM  Result Value Ref Range   Sodium 136 135 - 145 mmol/L   Potassium 4.0 3.5 - 5.1 mmol/L   Chloride 103 101 - 111 mmol/L   CO2 21 (L) 22 - 32 mmol/L   Glucose, Bld 112 (H) 65 - 99 mg/dL   BUN 74 (H) 6 - 20 mg/dL   Creatinine, Ser 7.82 (H) 0.61 - 1.24 mg/dL   Calcium 9.4 8.9 - 95.6 mg/dL   Total Protein 6.8 6.5 - 8.1  g/dL   Albumin 3.5 3.5 - 5.0 g/dL   AST 40 15 - 41 U/L   ALT 18 17 - 63 U/L   Alkaline Phosphatase 155 (H) 38 - 126 U/L   Total Bilirubin 1.5 (H) 0.3 - 1.2 mg/dL   GFR calc non Af Amer 12 (L) >60 mL/min   GFR calc Af Amer 14 (L) >60 mL/min   Anion gap 12 5 - 15  Troponin I     Status: Abnormal   Collection Time: 09/21/15  6:54 PM  Result Value Ref Range   Troponin I 0.22 (H) <0.031 ng/mL  Brain natriuretic peptide     Status: Abnormal   Collection Time: 09/21/15  6:54 PM  Result Value Ref Range   B Natriuretic Peptide 3636.0 (H) 0.0 - 100.0 pg/mL  Troponin I     Status: Abnormal   Collection Time: 09/21/15 11:28 PM  Result Value Ref Range   Troponin I 0.22 (H) <0.031 ng/mL  Troponin I     Status: Abnormal   Collection Time: 09/22/15  5:14 AM  Result Value Ref Range   Troponin I 0.24 (H) <0.031 ng/mL  Basic metabolic panel     Status: Abnormal   Collection Time: 09/22/15  5:14 AM  Result Value Ref Range   Sodium 136 135 - 145 mmol/L   Potassium 4.2 3.5 - 5.1 mmol/L   Chloride 104 101 - 111 mmol/L   CO2 24 22 - 32 mmol/L   Glucose, Bld 105 (H) 65 - 99 mg/dL   BUN 75 (H) 6 - 20 mg/dL   Creatinine, Ser 2.13 (H) 0.61 - 1.24 mg/dL   Calcium 9.2 8.9 - 08.6 mg/dL   GFR calc non Af Amer 12 (L) >60 mL/min   GFR calc Af Amer 14 (L) >60 mL/min   Anion gap 8 5 - 15  CBC     Status: Abnormal   Collection Time: 09/22/15  5:14 AM  Result Value Ref Range   WBC 4.9 3.8 - 10.6 K/uL   RBC 4.51 4.40 - 5.90 MIL/uL   Hemoglobin 11.4 (L) 13.0 - 18.0 g/dL   HCT 57.8 (L) 46.9 - 62.9 %   MCV 78.6 (L) 80.0 -  100.0 fL   MCH 25.2 (L) 26.0 - 34.0 pg   MCHC 32.1 32.0 - 36.0 g/dL   RDW 16.1 (H) 09.6 - 04.5 %   Platelets 169 150 - 440 K/uL   Dg Chest 2 View  09/21/2015  CLINICAL DATA:  Shortness of breath. Bilateral lower extremity edema. Oliguria. EXAM: CHEST  2 VIEW COMPARISON:  Multiple exams, including 02/01/2014 FINDINGS: Indistinct airspace opacities at the lung bases with bandlike density  in the left mid lung. Indistinct posterior costophrenic angles. Moderate enlargement of the cardiopericardial silhouette without edema. Dense coronary artery atherosclerotic calcification best observed on the lateral projection. Thoracic spondylosis.  Degenerative right glenohumeral arthropathy. IMPRESSION: 1. Bibasilar airspace opacities potentially from atelectasis or pneumonia. Small bilateral pleural effusions. 2. Moderate cardiomegaly. 3. Scarring in the left mid lung, possibly with adjacent bulla. Electronically Signed   By: Gaylyn Rong M.D.   On: 09/21/2015 18:22     ASSESSMENT AND PLAN: CHF with elevated BNP, and in setting of acute on chronic renal failure. Advise consideration for dialysis. Elevated troponins, may be due to renal failure but probably has CAD, is not candidated for invasive procedure such as cath.  Nguyet Mercer A

## 2015-09-22 NOTE — Plan of Care (Signed)
Problem: Safety: Goal: Ability to remain free from injury will improve Outcome: Progressing Patient has remained in his bed and has not attempted to get up. Will continue to monitor.

## 2015-09-22 NOTE — Progress Notes (Signed)
[facility]  ROUNDING NOTE   Subjective:  Patient well known to [place] as we follow him as an outpatient for underlying chronic kidney disease stage III/4. We saw him in January at which point in time EGFR was 27. He has a known atrophic right kidney. He presents now with increasing shortness of breath with exertion and increasing lower extremity edema. He appears to have acute renal failure at the moment with a creatinine of 4.1.    Objective:  Vital signs in last 24 hours:  Temp:  [97.4 F (36.3 C)-98.3 F (36.8 C)] 97.4 F (36.3 C) (02/27 1111) Pulse Rate:  [49-97] 54 (02/27 1111) Resp:  [16-20] 18 (02/27 1111) BP: (97-124)/(51-84) 97/51 mmHg (02/27 1111) SpO2:  [91 %-100 %] 99 % (02/27 1111) Weight:  [88.814 kg (195 lb 12.8 oz)-91.264 kg (201 lb 3.2 oz)] 91.264 kg (201 lb 3.2 oz) (02/27 0651)  Weight change:  Filed Weights   09/21/15 1720 09/21/15 2258 09/22/15 0651  Weight: 88.451 kg (195 lb) 88.814 kg (195 lb 12.8 oz) 91.264 kg (201 lb 3.2 oz)    Intake/Output: I/O last 3 completed shifts: In: -  Out: 100 [Urine:100]   Intake/Output this shift:  Total I/O In: 576 [P.O.:576] Out: -   Physical Exam: General: NAD, resting in bed  Head: Normocephalic, atraumatic. Moist oral mucosal membranes  Eyes: Anicteric  Neck: Supple, trachea midline  Lungs:  Basilar rales  Heart: S1S2 no obvious rubs  Abdomen:  Soft, nontender, BS present  Extremities: 2+ peripheral edema.  Neurologic: Nonfocal, moving all four extremities  Skin: No lesions       Basic Metabolic Panel:  Recent Labs Lab 09/21/15 1854 09/22/15 0514  NA 136 136  K 4.0 4.2  CL 103 104  CO2 21* 24  GLUCOSE 112* 105*  BUN 74* 75*  CREATININE 4.11* 4.05*  CALCIUM 9.4 9.2    Liver Function Tests:  Recent Labs Lab 09/21/15 1854  AST 40  ALT 18  ALKPHOS 155*  BILITOT 1.5*  PROT 6.8  ALBUMIN 3.5   No results for input(s): LIPASE, AMYLASE in the last 168 hours. No results for  input(s): AMMONIA in the last 168 hours.  CBC:  Recent Labs Lab 09/21/15 1854 09/22/15 0514  WBC 5.4 4.9  HGB 12.0* 11.4*  HCT 38.2* 35.5*  MCV 80.1 78.6*  PLT 180 169    Cardiac Enzymes:  Recent Labs Lab 09/21/15 1854 09/21/15 2328 09/22/15 0514 09/22/15 1045  TROPONINI 0.22* 0.22* 0.24* 0.21*    BNP: Invalid input(s): POCBNP  CBG: No results for input(s): GLUCAP in the last 168 hours.  Microbiology: No results found for this or any previous visit.  Coagulation Studies: No results for input(s): LABPROT, INR in the last 72 hours.  Urinalysis: No results for input(s): COLORURINE, LABSPEC, PHURINE, GLUCOSEU, HGBUR, BILIRUBINUR, KETONESUR, PROTEINUR, UROBILINOGEN, NITRITE, LEUKOCYTESUR in the last 72 hours.  Invalid input(s): APPERANCEUR    Imaging: Dg Chest 2 View  09/21/2015  CLINICAL DATA:  Shortness of breath. Bilateral lower extremity edema. Oliguria. EXAM: CHEST  2 VIEW COMPARISON:  Multiple exams, including 02/01/2014 FINDINGS: Indistinct airspace opacities at the lung bases with bandlike density in the left mid lung. Indistinct posterior costophrenic angles. Moderate enlargement of the cardiopericardial silhouette without edema. Dense coronary artery atherosclerotic calcification best observed on the lateral projection. Thoracic spondylosis.  Degenerative right glenohumeral arthropathy. IMPRESSION: 1. Bibasilar airspace opacities potentially from atelectasis or pneumonia. Small bilateral pleural effusions. 2. Moderate cardiomegaly. 3. Scarring in the  left mid lung, possibly with adjacent bulla. Electronically Signed   By: Van Clines M.D.   On: 09/21/2015 18:22   US Renal  09/22/2015  CLINICAL DATA:  Acute on chronic renal failure.  Inpatient. EXAM: RENAL / URINARY TRACT ULTRASOUND COMPLETE COMPARISON:  02/01/2014 renal sonogram.  02/08/2007 CT abdomen. FINDINGS: Right Kidney: Length: 7.2 cm. Asymmetrically small echogenic right kidney. No right  hydronephrosis. Simple exophytic 3.0 x 3.0 x 3.2 cm renal cyst in the upper right kidney, previously 3.1 x 3.1 x 2.9 cm, unchanged. Simple 1.9 x 1.8 x 2.0 cm renal cyst in the interpolar right kidney, previously 2.0 x 2.2 x 1.8 cm, unchanged. Simple appearing 1.8 x 1.8 x 1.9 cm renal cyst in the medial upper right kidney, previously 1.8 x 1.6 x 1.8 cm, unchanged. Left Kidney: Length: 10.3 cm. Echogenic normal size left kidney. No left hydronephrosis. Exophytic simple 3.9 x 5.2 x 4.7 cm interpolar left renal cyst, previously 5.5 x 4.0 x 4.9 cm, not appreciably changed. Simple appearing exophytic 2.3 x 2.6 x 2.1 cm renal cyst in the medial upper left kidney, previously 2.2 cm, not appreciably changed. Bladder: Bladder is collapsed by Foley catheter and cannot be evaluated on this scan. Incidentally noted is moderate volume abdominopelvic ascites. IMPRESSION: 1. No hydronephrosis. 2. Echogenic kidneys, indicating nonspecific renal parenchymal disease of uncertain chronicity. 3. Bilateral benign-appearing renal cysts. 4. Bladder collapsed by Foley catheter and not evaluated on this scan. 5. Incidental moderate volume abdominopelvic ascites. Electronically Signed   By: Ilona Sorrel M.D.   On: 09/22/2015 10:09     Medications:     . apixaban  2.5 mg Oral BID  . aspirin  81 mg Oral Daily  . enalapril  2.5 mg Oral Daily  . [START ON 09/23/2015] levothyroxine  150 mcg Oral QAC breakfast  . pantoprazole  40 mg Oral Daily  . simvastatin  20 mg Oral QHS  . sodium chloride flush  3 mL Intravenous Q12H   acetaminophen **OR** acetaminophen, ondansetron **OR** ondansetron (ZOFRAN) IV  Assessment/ Plan:  82 y.o. male of chronic kidney disease stage III, chronic systolic heart failure ejection fraction 30-35%, coronary artery disease, GERD, hypertension, chronic constipation, hyperlipidemia, and anxiety admitted with shortness of breath, increasing lower extremity edema in the setting of known systolic heart  failure.  1. Acute renal failure/chronic kidney disease stage IV baseline EGFR 27. We follow the patient for underlying chronic kidney disease. Baseline EGFR was 27 from January in our office. We will discontinue enalapril for now. Renal ultrasound was negative for obstruction. Acute renal failure now could be secondary to altered cardiorenal hemodynamics. Diuretics have been placed on hold for the moment. We may need to consider temporary dialysis of renal function continues to worsen.  2. Acute on chronic systolic heart failure.  BNP noted to be quite elevated. Renal failure could be contributing to his underlying heart failure.  Diuretics were placed on hold on admission. If renal function does not significantly improve by tomorrow we will consider a trial of temporary hemodialysis.  3. Anemia of chronic kidney disease. Hemoglobin currently 11.4. No indication for Procrit.   LOS: 1 Cyndee Giammarco 2/27/20176:42 PM

## 2015-09-22 NOTE — Progress Notes (Signed)
[facility] [place] at [facility]   PATIENT NAME: Dennis Rice    MR#:  [id number]  DATE OF BIRTH:  03/07/1924  SUBJECTIVE:  States breathing is somewhat better, no complaints this morning had Foley catheter placed overnight secondary to retention  REVIEW OF SYSTEMS:  CONSTITUTIONAL: No fever, fatigue or weakness.  EYES: No blurred or double vision.  EARS, NOSE, AND THROAT: No tinnitus or ear pain.  RESPIRATORY: No cough, shortness of breath, wheezing or hemoptysis.  CARDIOVASCULAR: No chest pain, orthopnea, edema.  GASTROINTESTINAL: No nausea, vomiting, diarrhea or abdominal pain.  GENITOURINARY: No dysuria, hematuria.  ENDOCRINE: No polyuria, nocturia,  HEMATOLOGY: No anemia, easy bruising or bleeding SKIN: No rash or lesion. MUSCULOSKELETAL: No joint pain or arthritis.   NEUROLOGIC: No tingling, numbness, weakness.  PSYCHIATRY: No anxiety or depression.   DRUG ALLERGIES:  No Known Allergies  VITALS:  Blood pressure 97/51, pulse 54, temperature 97.4 F (36.3 C), temperature source Oral, resp. rate 18, height  (1.753 m), weight 91.264 kg (201 lb 3.2 oz), SpO2 99 %.  PHYSICAL EXAMINATION:  VITAL SIGNS: Filed Vitals:   09/22/15 1036 09/22/15 1111  BP: 105/62 97/51  Pulse: 49 54  Temp: 97.8 F (36.6 C) 97.4 F (36.3 C)  Resp: 20 18   GENERAL:82 y.o.male currently in no acute distress.  HEAD: Normocephalic, atraumatic.  EYES: Pupils equal, round, reactive to light. Extraocular muscles intact. No scleral icterus.  MOUTH: Dry mucosal membrane. Dentition intact. No abscess noted.  EAR, NOSE, THROAT: Clear without exudates. No external lesions.  NECK: Supple. No thyromegaly. No nodules. No JVD.  PULMONARY: Clear to ascultation, without wheeze rails or rhonci. No use of accessory muscles, Good respiratory effort. good air entry bilaterally CHEST: Nontender to palpation.  CARDIOVASCULAR: S1 and S2. Regular rate and rhythm. No murmurs, rubs,  or gallops. 1+ edema. Pedal pulses 2+ bilaterally.  GASTROINTESTINAL: Soft, nontender, nondistended. No masses. Positive bowel sounds. No hepatosplenomegaly.  MUSCULOSKELETAL: No swelling, clubbing, or edema. Range of motion full in all extremities.  NEUROLOGIC: Cranial nerves II through XII are intact. No gross focal neurological deficits. Sensation intact. Reflexes intact.  SKIN: No ulceration, lesions, rashes, or cyanosis. Skin warm and dry. Turgor intact.  PSYCHIATRIC: Mood, affect flat The patient is awake, alert and oriented x 3. Insight, judgment intact.      LABORATORY PANEL:   CBC  Recent Labs Lab 09/22/15 0514  WBC 4.9  HGB 11.4*  HCT 35.5*  PLT 169   ------------------------------------------------------------------------------------------------------------------  Chemistries   Recent Labs Lab 09/21/15 1854 09/22/15 0514  NA 136 136  K 4.0 4.2  CL 103 104  CO2 21* 24  GLUCOSE 112* 105*  BUN 74* 75*  CREATININE 4.11* 4.05*  CALCIUM 9.4 9.2  AST 40  --   ALT 18  --   ALKPHOS 155*  --   BILITOT 1.5*  --    ------------------------------------------------------------------------------------------------------------------  Cardiac Enzymes  Recent Labs Lab 09/22/15 1045  TROPONINI 0.21*   ------------------------------------------------------------------------------------------------------------------  RADIOLOGY:  Dg Chest 2 View  09/21/2015  CLINICAL DATA:  Shortness of breath. Bilateral lower extremity edema. Oliguria. EXAM: CHEST  2 VIEW COMPARISON:  Multiple exams, including 02/01/2014 FINDINGS: Indistinct airspace opacities at the lung bases with bandlike density in the left mid lung. Indistinct posterior costophrenic angles. Moderate enlargement of the cardiopericardial silhouette without edema. Dense coronary artery atherosclerotic calcification best observed on the lateral projection. Thoracic spondylosis.  Degenerative right glenohumeral  arthropathy. IMPRESSION: 1. Bibasilar airspace opacities potentially  from atelectasis or pneumonia. Small bilateral pleural effusions. 2. Moderate cardiomegaly. 3. Scarring in the left mid lung, possibly with adjacent bulla. Electronically Signed   By: Gaylyn Rong M.D.   On: 09/21/2015 18:22   US Renal  09/22/2015  CLINICAL DATA:  Acute on chronic renal failure.  Inpatient. EXAM: RENAL / URINARY TRACT ULTRASOUND COMPLETE COMPARISON:  02/01/2014 renal sonogram.  02/08/2007 CT abdomen. FINDINGS: Right Kidney: Length: 7.2 cm. Asymmetrically small echogenic right kidney. No right hydronephrosis. Simple exophytic 3.0 x 3.0 x 3.2 cm renal cyst in the upper right kidney, previously 3.1 x 3.1 x 2.9 cm, unchanged. Simple 1.9 x 1.8 x 2.0 cm renal cyst in the interpolar right kidney, previously 2.0 x 2.2 x 1.8 cm, unchanged. Simple appearing 1.8 x 1.8 x 1.9 cm renal cyst in the medial upper right kidney, previously 1.8 x 1.6 x 1.8 cm, unchanged. Left Kidney: Length: 10.3 cm. Echogenic normal size left kidney. No left hydronephrosis. Exophytic simple 3.9 x 5.2 x 4.7 cm interpolar left renal cyst, previously 5.5 x 4.0 x 4.9 cm, not appreciably changed. Simple appearing exophytic 2.3 x 2.6 x 2.1 cm renal cyst in the medial upper left kidney, previously 2.2 cm, not appreciably changed. Bladder: Bladder is collapsed by Foley catheter and cannot be evaluated on this scan. Incidentally noted is moderate volume abdominopelvic ascites. IMPRESSION: 1. No hydronephrosis. 2. Echogenic kidneys, indicating nonspecific renal parenchymal disease of uncertain chronicity. 3. Bilateral benign-appearing renal cysts. 4. Bladder collapsed by Foley catheter and not evaluated on this scan. 5. Incidental moderate volume abdominopelvic ascites. Electronically Signed   By: Delbert Phenix M.D.   On: 09/22/2015 10:09    EKG:   Orders placed or performed during the hospital encounter of 09/21/15  . ED EKG  . ED EKG  . ED EKG  . ED EKG     ASSESSMENT AND PLAN:   82 year old African-American gentleman admitted 09/21/15 with shortness of breath and edema found to have worsening renal function  1. Acute kidney injury: Nephrology consult pending, question cardiorenal syndrome patient does not same remarkably volume overloaded currently reported ovoid overdiuresis given renal function 2. Acute on chronic systolic congestive heart failure: Cardiology input appreciated 3. GERD without esophagitis PPI therapy 4. Hyperlipidemia unspecified statin therapy 5. Venous embolism prophylactic: Eliquis     All the records are reviewed and case discussed with Care Management/Social Workerr. Management plans discussed with the patient, family and they are in agreement.  CODE STATUS: Full  TOTAL TIME TAKING CARE OF THIS PATIENT: 28 minutes.   POSSIBLE D/C IN 3-4 DAYS, DEPENDING ON CLINICAL CONDITION.   Emalia Witkop,  Mardi Mainland.D on 09/22/2015 at 2:01 PM  Between 7am to 6pm - Pager - [phone]  After 6pm: House Pager: - [phone]  [facility]  Office  [phone]  CC: Primary care physician; Derwood Kaplan, MD

## 2015-09-22 NOTE — Progress Notes (Signed)
Pt place on [facility] CPAP C-3 for sleep

## 2015-09-23 LAB — BASIC METABOLIC PANEL
Anion gap: 7 (ref 5–15)
BUN: 70 mg/dL — AB (ref 6–20)
CALCIUM: 9.1 mg/dL (ref 8.9–10.3)
CO2: 26 mmol/L (ref 22–32)
CREATININE: 3.41 mg/dL — AB (ref 0.61–1.24)
Chloride: 105 mmol/L (ref 101–111)
GFR, EST AFRICAN AMERICAN: 17 mL/min — AB (ref >60)
GFR, EST NON AFRICAN AMERICAN: 15 mL/min — AB (ref >60)
Glucose, Bld: 90 mg/dL (ref 65–99)
Potassium: 4.2 mmol/L (ref 3.5–5.1)
Sodium: 138 mmol/L (ref 135–145)

## 2015-09-23 NOTE — Care Management (Signed)
There are concerns that patient is going to required skilled nursing placement.  OT consult is pending

## 2015-09-23 NOTE — Progress Notes (Signed)
[facility] - [place] at [facility]   PATIENT NAME: Dennis Rice    MR#:  [id number]  DATE OF BIRTH:  Apr 09, 1924  SUBJECTIVE:  States breathing is somewhat better, no complaints  Awake he states he is having some trouble eating breakfast and feeding himself  REVIEW OF SYSTEMS:  CONSTITUTIONAL: No fever, fatigue or weakness.  EYES: No blurred or double vision.  EARS, NOSE, AND THROAT: No tinnitus or ear pain.  RESPIRATORY: No cough, shortness of breath, wheezing or hemoptysis.  CARDIOVASCULAR: No chest pain, orthopnea, edema.  GASTROINTESTINAL: No nausea, vomiting, diarrhea or abdominal pain.  GENITOURINARY: No dysuria, hematuria.  ENDOCRINE: No polyuria, nocturia,  HEMATOLOGY: No anemia, easy bruising or bleeding SKIN: No rash or lesion. MUSCULOSKELETAL: No joint pain or arthritis.   NEUROLOGIC: No tingling, numbness, weakness.  PSYCHIATRY: No anxiety or depression.   DRUG ALLERGIES:  No Known Allergies  VITALS:  Blood pressure 120/60, pulse 74, temperature 97.6 F (36.4 C), temperature source Oral, resp. rate 18, height  (1.753 m), weight 92.216 kg (203 lb 4.8 oz), SpO2 95 %.  PHYSICAL EXAMINATION:  VITAL SIGNS: Filed Vitals:   09/23/15 0929 09/23/15 1242  BP: 114/64 120/60  Pulse: 59 74  Temp: 97.9 F (36.6 C) 97.6 F (36.4 C)  Resp: 18 18   GENERAL:82 y.o.male currently in no acute distress.  HEAD: Normocephalic, atraumatic.  EYES: Pupils equal, round, reactive to light. Extraocular muscles intact. No scleral icterus.  MOUTH: Dry mucosal membrane. Dentition intact. No abscess noted.  EAR, NOSE, THROAT: Clear without exudates. No external lesions.  NECK: Supple. No thyromegaly. No nodules. No JVD.  PULMONARY: Diminished without wheeze rails or rhonci. No use of accessory muscles, Good respiratory effort. good air entry bilaterally CHEST: Nontender to palpation.  CARDIOVASCULAR: S1 and S2. Regular rate and rhythm. No murmurs, rubs,  or gallops. 1+ edema. Pedal pulses 2+ bilaterally.  GASTROINTESTINAL: Soft, nontender, nondistended. No masses. Positive bowel sounds. No hepatosplenomegaly.  MUSCULOSKELETAL: No swelling, clubbing, or edema. Range of motion full in all extremities.  NEUROLOGIC: Cranial nerves II through XII are intact. No gross focal neurological deficits. Sensation intact. Reflexes intact.  SKIN: No ulceration, lesions, rashes, or cyanosis. Skin warm and dry. Turgor intact.  PSYCHIATRIC: Mood, affect flat The patient is awake, alert and oriented x 3. Insight, judgment intact.      LABORATORY PANEL:   CBC  Recent Labs Lab 09/22/15 0514  WBC 4.9  HGB 11.4*  HCT 35.5*  PLT 169   ------------------------------------------------------------------------------------------------------------------  Chemistries   Recent Labs Lab 09/21/15 1854  09/23/15 0530  NA 136  < > 138  K 4.0  < > 4.2  CL 103  < > 105  CO2 21*  < > 26  GLUCOSE 112*  < > 90  BUN 74*  < > 70*  CREATININE 4.11*  < > 3.41*  CALCIUM 9.4  < > 9.1  AST 40  --   --   ALT 18  --   --   ALKPHOS 155*  --   --   BILITOT 1.5*  --   --   < > = values in this interval not displayed. ------------------------------------------------------------------------------------------------------------------  Cardiac Enzymes  Recent Labs Lab 09/22/15 1045  TROPONINI 0.21*   ------------------------------------------------------------------------------------------------------------------  RADIOLOGY:  Dg Chest 2 View  09/21/2015  CLINICAL DATA:  Shortness of breath. Bilateral lower extremity edema. Oliguria. EXAM: CHEST  2 VIEW COMPARISON:  Multiple exams, including 02/01/2014 FINDINGS: Indistinct airspace opacities at the  lung bases with bandlike density in the left mid lung. Indistinct posterior costophrenic angles. Moderate enlargement of the cardiopericardial silhouette without edema. Dense coronary artery atherosclerotic calcification  best observed on the lateral projection. Thoracic spondylosis.  Degenerative right glenohumeral arthropathy. IMPRESSION: 1. Bibasilar airspace opacities potentially from atelectasis or pneumonia. Small bilateral pleural effusions. 2. Moderate cardiomegaly. 3. Scarring in the left mid lung, possibly with adjacent bulla. Electronically Signed   By: Gaylyn Rong M.D.   On: 09/21/2015 18:22   US Renal  09/22/2015  CLINICAL DATA:  Acute on chronic renal failure.  Inpatient. EXAM: RENAL / URINARY TRACT ULTRASOUND COMPLETE COMPARISON:  02/01/2014 renal sonogram.  02/08/2007 CT abdomen. FINDINGS: Right Kidney: Length: 7.2 cm. Asymmetrically small echogenic right kidney. No right hydronephrosis. Simple exophytic 3.0 x 3.0 x 3.2 cm renal cyst in the upper right kidney, previously 3.1 x 3.1 x 2.9 cm, unchanged. Simple 1.9 x 1.8 x 2.0 cm renal cyst in the interpolar right kidney, previously 2.0 x 2.2 x 1.8 cm, unchanged. Simple appearing 1.8 x 1.8 x 1.9 cm renal cyst in the medial upper right kidney, previously 1.8 x 1.6 x 1.8 cm, unchanged. Left Kidney: Length: 10.3 cm. Echogenic normal size left kidney. No left hydronephrosis. Exophytic simple 3.9 x 5.2 x 4.7 cm interpolar left renal cyst, previously 5.5 x 4.0 x 4.9 cm, not appreciably changed. Simple appearing exophytic 2.3 x 2.6 x 2.1 cm renal cyst in the medial upper left kidney, previously 2.2 cm, not appreciably changed. Bladder: Bladder is collapsed by Foley catheter and cannot be evaluated on this scan. Incidentally noted is moderate volume abdominopelvic ascites. IMPRESSION: 1. No hydronephrosis. 2. Echogenic kidneys, indicating nonspecific renal parenchymal disease of uncertain chronicity. 3. Bilateral benign-appearing renal cysts. 4. Bladder collapsed by Foley catheter and not evaluated on this scan. 5. Incidental moderate volume abdominopelvic ascites. Electronically Signed   By: Delbert Phenix M.D.   On: 09/22/2015 10:09    EKG:   Orders placed or  performed during the hospital encounter of 09/21/15  . ED EKG  . ED EKG  . ED EKG  . ED EKG    ASSESSMENT AND PLAN:   82 year old African-American gentleman admitted 09/21/15 with shortness of breath and edema found to have worsening renal function  1. Acute kidney injury: Nephrology consult appreciated,  question cardiorenal syndrome patient does not same remarkably volume overloaded currently Actual improvement in renal function and avoid further nephrotoxic agents continue to monitor  2. Acute on chronic systolic congestive heart failure: Cardiology input appreciated 3. GERD without esophagitis PPI therapy 4. Hyperlipidemia unspecified statin therapy 5. Venous embolism prophylactic: Eliquis   Disposition: Physical therapy evaluation likely need placement  All the records are reviewed and case discussed with Care Management/Social Workerr. Management plans discussed with the patient, family and they are in agreement.  CODE STATUS: Full  TOTAL TIME TAKING CARE OF THIS PATIENT: 28 minutes.   POSSIBLE D/C IN 2-3 DAYS, DEPENDING ON CLINICAL CONDITION.   Hower,  Mardi Mainland.D on 09/23/2015 at 2:37 PM  Between 7am to 6pm - Pager - [phone]  After 6pm: House Pager: - [phone]  [facility]  Office  [phone]  CC: Primary care physician; Derwood Kaplan, MD

## 2015-09-23 NOTE — Progress Notes (Signed)
SUBJECTIVE: Patient is feeling much better no shortness of breath   Filed Vitals:   09/22/15 1111 09/22/15 2030 09/23/15 0356 09/23/15 0500  BP: 97/51 119/56 110/56   Pulse: 54 63 56   Temp: 97.4 F (36.3 C) 97.7 F (36.5 C) 98 F (36.7 C)   TempSrc: Oral Oral    Resp: Height:      Weight:    203 lb 4.8 oz (92.216 kg)  SpO2: 99% 98% 94%     Intake/Output Summary (Last 24 hours) at 09/23/15 0915 Last data filed at 09/23/15 0535  Gross per 24 hour  Intake    579 ml  Output   1250 ml  Net   -671 ml    LABS: Basic Metabolic Panel:  Recent Labs  16/10/96 0514 09/23/15 0530  NA 136 138  K 4.2 4.2  CL 104 105  CO2 24 26  GLUCOSE 105* 90  BUN 75* 70*  CREATININE 4.05* 3.41*  CALCIUM 9.2 9.1   Liver Function Tests:  Recent Labs  09/21/15 1854  AST 40  ALT 18  ALKPHOS 155*  BILITOT 1.5*  PROT 6.8  ALBUMIN 3.5   No results for input(s): LIPASE, AMYLASE in the last 72 hours. CBC:  Recent Labs  09/21/15 1854 09/22/15 0514  WBC 5.4 4.9  HGB 12.0* 11.4*  HCT 38.2* 35.5*  MCV 80.1 78.6*  PLT 180 169   Cardiac Enzymes:  Recent Labs  09/21/15 2328 09/22/15 0514 09/22/15 1045  TROPONINI 0.22* 0.24* 0.21*   BNP: Invalid input(s): POCBNP D-Dimer: No results for input(s): DDIMER in the last 72 hours. Hemoglobin A1C: No results for input(s): HGBA1C in the last 72 hours. Fasting Lipid Panel: No results for input(s): CHOL, HDL, LDLCALC, TRIG, CHOLHDL, LDLDIRECT in the last 72 hours. Thyroid Function Tests: No results for input(s): TSH, T4TOTAL, T3FREE, THYROIDAB in the last 72 hours.  Invalid input(s): FREET3 Anemia Panel: No results for input(s): VITAMINB12, FOLATE, FERRITIN, TIBC, IRON, RETICCTPCT in the last 72 hours.   PHYSICAL EXAM General: Well developed, well nourished, in no acute distress HEENT:  Normocephalic and atramatic Neck:  No JVD.  Lungs: Clear bilaterally to auscultation and percussion. Heart: HRRR . Normal S1 and S2  without gallops or murmurs.  Abdomen: Bowel sounds are positive, abdomen soft and non-tender  Msk:  Back normal, normal gait. Normal strength and tone for age. Extremities: No clubbing, cyanosis or edema.   Neuro: Alert and oriented X 3. Psych:  Good affect, responds appropriately  TELEMETRY: A. fib with controlled rate  ASSESSMENT AND PLAN: Renal failure and CHF. Patient has acute on chronic renal failure. Advise dialysis. Patient denies any chest pain, or shortness of breath. Ejection fraction on echocardiogram was severely depressed with large left pleural effusion, there was severe LVH with severely dilated atria is suggestive of amyloid disease. With all what's going on with the patient and patient's age and it might be a good idea to get DO NOT RESUSCITATE status.  Principal Problem:   Acute on chronic renal failure (HCC) Active Problems:   GERD (gastroesophageal reflux disease)   Oliguria   BPH (benign prostatic hyperplasia)   HTN (hypertension)   OSA on CPAP   Acute on chronic systolic CHF (congestive heart failure) (HCC)    Lyndi Holbein A, MD, [facility] 09/23/2015 9:15 AM

## 2015-09-23 NOTE — Care Management Important Message (Signed)
Important Message  Patient Details  Name: Dennis Rice MRN: [id number] Date of Birth: [birth year]   Medicare Important Message Given:  Yes    [name] 09/23/2015, 10:17 AM

## 2015-09-23 NOTE — Progress Notes (Signed)
[facility]  ROUNDING NOTE   Subjective:  Pt has improved renal function. Cr down to 3.41 after holding diuretics. This supports there may have been some element of intravascular volume depletion.  Objective:  Vital signs in last 24 hours:  Temp:  [97.6 F (36.4 C)-98 F (36.7 C)] 97.6 F (36.4 C) (02/28 1242) Pulse Rate:  [56-74] 74 (02/28 1242) Resp:  [18-20] 18 (02/28 1242) BP: (110-120)/(56-64) 120/60 mmHg (02/28 1242) SpO2:  [92 %-98 %] 95 % (02/28 1242) Weight:  [92.216 kg (203 lb 4.8 oz)] 92.216 kg (203 lb 4.8 oz) (02/28 0500)  Weight change: 3.765 kg (8 lb 4.8 oz) Filed Weights   09/21/15 2258 09/22/15 0651 09/23/15 0500  Weight: 88.814 kg (195 lb 12.8 oz) 91.264 kg (201 lb 3.2 oz) 92.216 kg (203 lb 4.8 oz)    Intake/Output: I/O last 3 completed shifts: In: 1059 [P.O.:1056; I.V.:3] Out: 1250 [Urine:1250]   Intake/Output this shift:     Physical Exam: General: NAD, resting in bed  Head: Normocephalic, atraumatic. Moist oral mucosal membranes  Eyes: Anicteric  Neck: Supple, trachea midline  Lungs:  Basilar rales  Heart: S1S2 no obvious rubs  Abdomen:  Soft, nontender, BS present  Extremities: 2+ peripheral edema.  Neurologic: Nonfocal, moving all four extremities  Skin: No lesions       Basic Metabolic Panel:  Recent Labs Lab 09/21/15 1854 09/22/15 0514 09/23/15 0530  NA 136 136 138  K 4.0 4.2 4.2  CL 103 104 105  CO2 21* 24 26  GLUCOSE 112* 105* 90  BUN 74* 75* 70*  CREATININE 4.11* 4.05* 3.41*  CALCIUM 9.4 9.2 9.1    Liver Function Tests:  Recent Labs Lab 09/21/15 1854  AST 40  ALT 18  ALKPHOS 155*  BILITOT 1.5*  PROT 6.8  ALBUMIN 3.5   No results for input(s): LIPASE, AMYLASE in the last 168 hours. No results for input(s): AMMONIA in the last 168 hours.  CBC:  Recent Labs Lab 09/21/15 1854 09/22/15 0514  WBC 5.4 4.9  HGB 12.0* 11.4*  HCT 38.2* 35.5*  MCV 80.1 78.6*  PLT 180 169    Cardiac  Enzymes:  Recent Labs Lab 09/21/15 1854 09/21/15 2328 09/22/15 0514 09/22/15 1045  TROPONINI 0.22* 0.22* 0.24* 0.21*    BNP: Invalid input(s): POCBNP  CBG: No results for input(s): GLUCAP in the last 168 hours.  Microbiology: No results found for this or any previous visit.  Coagulation Studies: No results for input(s): LABPROT, INR in the last 72 hours.  Urinalysis: No results for input(s): COLORURINE, LABSPEC, PHURINE, GLUCOSEU, HGBUR, BILIRUBINUR, KETONESUR, PROTEINUR, UROBILINOGEN, NITRITE, LEUKOCYTESUR in the last 72 hours.  Invalid input(s): APPERANCEUR    Imaging: US Renal  09/22/2015  CLINICAL DATA:  Acute on chronic renal failure.  Inpatient. EXAM: RENAL / URINARY TRACT ULTRASOUND COMPLETE COMPARISON:  02/01/2014 renal sonogram.  02/08/2007 CT abdomen. FINDINGS: Right Kidney: Length: 7.2 cm. Asymmetrically small echogenic right kidney. No right hydronephrosis. Simple exophytic 3.0 x 3.0 x 3.2 cm renal cyst in the upper right kidney, previously 3.1 x 3.1 x 2.9 cm, unchanged. Simple 1.9 x 1.8 x 2.0 cm renal cyst in the interpolar right kidney, previously 2.0 x 2.2 x 1.8 cm, unchanged. Simple appearing 1.8 x 1.8 x 1.9 cm renal cyst in the medial upper right kidney, previously 1.8 x 1.6 x 1.8 cm, unchanged. Left Kidney: Length: 10.3 cm. Echogenic normal size left kidney. No left hydronephrosis. Exophytic simple 3.9 x 5.2 x 4.7 cm interpolar  left renal cyst, previously 5.5 x 4.0 x 4.9 cm, not appreciably changed. Simple appearing exophytic 2.3 x 2.6 x 2.1 cm renal cyst in the medial upper left kidney, previously 2.2 cm, not appreciably changed. Bladder: Bladder is collapsed by Foley catheter and cannot be evaluated on this scan. Incidentally noted is moderate volume abdominopelvic ascites. IMPRESSION: 1. No hydronephrosis. 2. Echogenic kidneys, indicating nonspecific renal parenchymal disease of uncertain chronicity. 3. Bilateral benign-appearing renal cysts. 4. Bladder collapsed  by Foley catheter and not evaluated on this scan. 5. Incidental moderate volume abdominopelvic ascites. Electronically Signed   By: Ilona Sorrel M.D.   On: 09/22/2015 10:09     Medications:     . apixaban  2.5 mg Oral BID  . aspirin  81 mg Oral Daily  . levothyroxine  150 mcg Oral QAC breakfast  . pantoprazole  40 mg Oral Daily  . simvastatin  20 mg Oral QHS  . sodium chloride flush  3 mL Intravenous Q12H   acetaminophen **OR** acetaminophen, ondansetron **OR** ondansetron (ZOFRAN) IV  Assessment/ Plan:  82 y.o. male of chronic kidney disease stage III, chronic systolic heart failure ejection fraction 30-35%, coronary artery disease, GERD, hypertension, chronic constipation, hyperlipidemia, and anxiety admitted with shortness of breath, increasing lower extremity edema in the setting of known systolic heart failure.  1. Acute renal failure/chronic kidney disease stage IV baseline EGFR 27. We follow the patient for underlying chronic kidney disease. Baseline EGFR was 27 from January in our office. Renal ultrasound was negative for obstruction. Acute renal failure now could be secondary to altered cardiorenal hemodynamics.  - After holding diuretics Cr down to 3.41 which suggests that intravascular volume depletion may have possible played a role, even with total body increased volume.  Continue to hold diuretics, pt taking in adequate PO, therefore no need for additional volume input.   2. Acute on chronic systolic heart failure.  BNP noted to be quite elevated. Renal failure could be contributing to his underlying heart failure.  Diuretics were placed on hold on admission. Continue to hold for now.  3. Anemia of chronic kidney disease. Hemoglobin currently 11.4. Hold off on procrit.   LOS: 2 Desree Leap 2/28/20177:48 PM

## 2015-09-24 LAB — CBC
HCT: 36.1 % — ABNORMAL LOW (ref 40.0–52.0)
HEMOGLOBIN: 11.6 g/dL — AB (ref 13.0–18.0)
MCH: 25.4 pg — AB (ref 26.0–34.0)
MCHC: 32.3 g/dL (ref 32.0–36.0)
MCV: 78.6 fL — AB (ref 80.0–100.0)
Platelets: 181 10*3/uL (ref 150–440)
RBC: 4.59 MIL/uL (ref 4.40–5.90)
RDW: 19.4 % — ABNORMAL HIGH (ref 11.5–14.5)
WBC: 6.9 10*3/uL (ref 3.8–10.6)

## 2015-09-24 LAB — CREATININE, SERUM
CREATININE: 2.63 mg/dL — AB (ref 0.61–1.24)
GFR calc Af Amer: 23 mL/min — ABNORMAL LOW (ref >60)
GFR calc non Af Amer: 20 mL/min — ABNORMAL LOW (ref >60)

## 2015-09-24 MED ORDER — SODIUM CHLORIDE 0.9% FLUSH: 3.0000 mL | INTRAVENOUS | Status: DC | PRN

## 2015-09-24 NOTE — Discharge Instructions (Signed)
Heart Failure Clinic appointment on October 03, 2015 at 10:30am with Clarisa Kindred, FNP. Please call [phone] to reschedule.

## 2015-09-24 NOTE — Progress Notes (Signed)
[facility] - [place] at [facility]   PATIENT NAME: Dennis Rice    MR#:  [id number]  DATE OF BIRTH:  October 15, 1923  SUBJECTIVE:  No complaints this morning, denies shortness of breath  REVIEW OF SYSTEMS:  CONSTITUTIONAL: No fever, fatigue or weakness.  EYES: No blurred or double vision.  EARS, NOSE, AND THROAT: No tinnitus or ear pain.  RESPIRATORY: No cough, shortness of breath, wheezing or hemoptysis.  CARDIOVASCULAR: No chest pain, orthopnea, positive edema.  GASTROINTESTINAL: No nausea, vomiting, diarrhea or abdominal pain.  GENITOURINARY: No dysuria, hematuria.  ENDOCRINE: No polyuria, nocturia,  HEMATOLOGY: No anemia, easy bruising or bleeding SKIN: No rash or lesion. MUSCULOSKELETAL: No joint pain or arthritis.   NEUROLOGIC: No tingling, numbness, weakness.  PSYCHIATRY: No anxiety or depression.   DRUG ALLERGIES:  No Known Allergies  VITALS:  Blood pressure 150/83, pulse 61, temperature 97.3 F (36.3 C), temperature source Oral, resp. rate 22, height  (1.753 m), weight 89.994 kg (198 lb 6.4 oz), SpO2 93 %.  PHYSICAL EXAMINATION:  VITAL SIGNS: Filed Vitals:   09/24/15 0343 09/24/15 1133  BP: 115/77 150/83  Pulse: 71 61  Temp: 98.2 F (36.8 C) 97.3 F (36.3 C)  Resp: 20 22   GENERAL:82 y.o.male currently in no acute distress.  HEAD: Normocephalic, atraumatic.  EYES: Pupils equal, round, reactive to light. Extraocular muscles intact. No scleral icterus.  MOUTH: Dry mucosal membrane. Dentition intact. No abscess noted.  EAR, NOSE, THROAT: Clear without exudates. No external lesions.  NECK: Supple. No thyromegaly. No nodules. No JVD.  PULMONARY: Diminished without wheeze rails or rhonci. No use of accessory muscles, Good respiratory effort. good air entry bilaterally CHEST: Nontender to palpation.  CARDIOVASCULAR: S1 and S2. Regular rate and rhythm. No murmurs, rubs, or gallops. 1+ edema. Pedal pulses 2+ bilaterally.   GASTROINTESTINAL: Soft, nontender, nondistended. No masses. Positive bowel sounds. No hepatosplenomegaly.  MUSCULOSKELETAL: No swelling, clubbing, or edema. Range of motion full in all extremities.  NEUROLOGIC: Cranial nerves II through XII are intact. No gross focal neurological deficits. Sensation intact. Reflexes intact.  SKIN: No ulceration, lesions, rashes, or cyanosis. Skin warm and dry. Turgor intact.  PSYCHIATRIC: Mood, affect flat The patient is awake, alert and oriented x 3. Insight, judgment intact.      LABORATORY PANEL:   CBC  Recent Labs Lab 09/24/15 0613  WBC 6.9  HGB 11.6*  HCT 36.1*  PLT 181   ------------------------------------------------------------------------------------------------------------------  Chemistries   Recent Labs Lab 09/21/15 1854  09/23/15 0530 09/24/15 0613  NA 136  < > 138  --   K 4.0  < > 4.2  --   CL 103  < > 105  --   CO2 21*  < > 26  --   GLUCOSE 112*  < > 90  --   BUN 74*  < > 70*  --   CREATININE 4.11*  < > 3.41* 2.63*  CALCIUM 9.4  < > 9.1  --   AST 40  --   --   --   ALT 18  --   --   --   ALKPHOS 155*  --   --   --   BILITOT 1.5*  --   --   --   < > = values in this interval not displayed. ------------------------------------------------------------------------------------------------------------------  Cardiac Enzymes  Recent Labs Lab 09/22/15 1045  TROPONINI 0.21*   ------------------------------------------------------------------------------------------------------------------  RADIOLOGY:  No results found.  EKG:   Orders placed or performed  during the hospital encounter of 09/21/15  . ED EKG  . ED EKG  . ED EKG  . ED EKG    ASSESSMENT AND PLAN:   82 year old African-American gentleman admitted 09/21/15 with shortness of breath and edema found to have worsening renal function  1. Acute kidney injury: Nephrology consult appreciated,  Continued improvement, avoid nephrotoxic agents  2. Acute  on chronic systolic congestive heart failure: Cardiology input appreciated diuretics on hold for now 3. GERD without esophagitis PPI therapy 4. Hyperlipidemia unspecified statin therapy 5. Venous embolism prophylactic: Eliquis   Disposition: SNF, anticipate discharge tomorrow  All the records are reviewed and case discussed with Care Management/Social Workerr. Management plans discussed with the patient, family and they are in agreement.  CODE STATUS: Full  TOTAL TIME TAKING CARE OF THIS PATIENT: 28 minutes.   POSSIBLE D/C IN 1 DAYS, DEPENDING ON CLINICAL CONDITION.   Eyla Tallon,  Mardi Mainland.D on 09/24/2015 at 3:16 PM  Between 7am to 6pm - Pager - [phone]  After 6pm: House Pager: - [phone]  [facility]  Office  [phone]  CC: Primary care physician; Derwood Kaplan, MD

## 2015-09-24 NOTE — Progress Notes (Signed)
Appointment scheduled in the Heart Failure Clinic on October 03, 2015 at 10:30am. He was last seen in the clinic April 2016.

## 2015-09-24 NOTE — Care Management (Signed)
Spoke with patient and his son Gery Pray.  Gery Pray says that patient has been getting weaker since November.  To the point that he was not able to get up from a sitting position.  Patient lives with his other sonAurther Loft.  Patient does have a walker.  He does need supervision and assist with his adls. Discussed that physical therapy is recommending skilled nursing placement.  Updated CSW.  This is a decision that is shared by patient's two sons who live local and daughter who lives in [place]

## 2015-09-24 NOTE — Clinical Social Work Note (Signed)
Clinical Social Work Assessment  Patient Details  Name: Dennis Rice MRN: [id number] Date of Birth: August 01, 1923  Date of referral:  09/24/15               Reason for consult:  Discharge Planning                Permission sought to share information with:  Family Supports Permission granted to share information::  Yes, Verbal Permission Granted  Name::        Agency::     Relationship::   Dennis Rice- Son [phone])  Contact Information:     Housing/Transportation Living arrangements for the past 2 months:  [place] of Information:  Patient, Adult Children Dennis Rice [phone]) Patient Interpreter Needed:  None Criminal Activity/Legal Involvement Pertinent to Current Situation/Hospitalization:  No - Comment as needed Significant Relationships:  Adult Children, Other Family Members Lives with:  Adult Children Do you feel safe going back to the place where you live?  Yes Need for family participation in patient care:  Yes (Comment) Dennis Rice- Son [phone])  Care giving concerns:  Patient's family stated that patient is getting weaker and needs assistance with regaining hs strength.    Social Worker assessment / plan:  CSW was informed by Chief Strategy Officer that patient's son is interested in SNF placement. CSW met with patient and his son Dennis Rice) at bedside. Patient was sitting up in his chair watching television. Requested permission to discuss discharge plans in front of son. Patient granted permission. CSW explained her role and inquired about discharge plans. Patient reports that he'd like to return home but would consider SNF placement. His son reports that he's worried about patient and fears that he cannot stay home alone. Reports that patient only wants family to care for him and somewhat it can become overwhelming. Reports that he'd like to make a decision about SNF once his siblings get to [facility]. CSW left a list of SNFs in [facility] with patient and his son.  Encouraged them to review. Verbal permission granted to send SNF referral to SNFs in [facility]. Patient's son reports that he'd like to review tihe list with his siblings.   FL2/ PASRR completed and faxed to SNFs in [facility]. Awaiting bed offers. CSW will continue to follow and assist.   Employment status:  Retired Forensic scientist:  Medicare PT Recommendations:  [place] / Referral to community resources:  [place]  Patient/Family's Response to care:  Patient is undecided about SNF placement but is willing to consider it. Patient's son feels that patient needs SNF placement.   Patient/Family's Understanding of and Emotional Response to Diagnosis, Current Treatment, and Prognosis:  Patient and his son report they understand CSW's role and are appreciative of her assistance.   Emotional Assessment Appearance:  Appears stated age Attitude/Demeanor/Rapport:   (None) Affect (typically observed):  Calm, Pleasant Orientation:  Oriented to Self, Oriented to Place, Oriented to  Time, Oriented to Situation Alcohol / Substance use:  Not Applicable Psych involvement (Current and /or in the community):  No (Comment)  Discharge Needs  Concerns to be addressed:  Discharge Planning Concerns Readmission within the last 30 days:  No Current discharge risk:  Chronically ill Barriers to Discharge:  Continued Medical Work up   [employer], LCSW 09/24/2015, 5:20 PM

## 2015-09-24 NOTE — NC FL2 (Signed)
[place] MEDICAID FL2 LEVEL OF CARE SCREENING TOOL     IDENTIFICATION  Patient Name: Dennis Rice Birthdate: 1924/03/30 Sex: male Admission Date (Current Location): 09/21/2015  [place] and [place] Number:  Chiropodist and Address:  [facility], [address] [place], [place] [address]      Provider Number: [id number]  Attending Physician Name and Address:  Wyatt Haste, MD  Relative Name and Phone Number:       Current Level of Care: Hospital Recommended Level of Care: Skilled Nursing Facility Prior Approval Number:    Date Approved/Denied:   PASRR Number:  [id number])  Discharge Plan: SNF    Current Diagnoses: Patient Active Problem List   Diagnosis Date Noted  . Oliguria 09/21/2015  . Acute on chronic renal failure (HCC) 09/21/2015  . BPH (benign prostatic hyperplasia) 09/21/2015  . HTN (hypertension) 09/21/2015  . OSA on CPAP 09/21/2015  . Acute on chronic systolic CHF (congestive heart failure) (HCC) 09/21/2015  . Chronic systolic heart failure (HCC) 10/26/2014  . GERD (gastroesophageal reflux disease) 10/26/2014    Orientation RESPIRATION BLADDER Height & Weight     Self, Time, Situation, Place  Normal Continent Weight: 198 lb 6.4 oz (89.994 kg) Height:   (175.3 cm)  BEHAVIORAL SYMPTOMS/MOOD NEUROLOGICAL BOWEL NUTRITION STATUS   (None)  (None) Continent Diet (renal with fluid restriction )  AMBULATORY STATUS COMMUNICATION OF NEEDS Skin   Extensive Assist Verbally Normal                       Personal Care Assistance Level of Assistance  Bathing, Feeding, Dressing Bathing Assistance: Limited assistance Feeding assistance: Independent Dressing Assistance: Limited assistance     Functional Limitations Info  Sight, Hearing, Speech Sight Info: Adequate Hearing Info: Adequate Speech Info: Adequate    SPECIAL CARE FACTORS FREQUENCY  PT (By licensed PT)     PT Frequency:  (5)               Contractures      Additional Factors Info  Code Status, Allergies Code Status Info:  (Full Code) Allergies Info:  (No Know Allergies )           Current Medications (09/24/2015):  This is the current hospital active medication list Current Facility-Administered Medications  Medication Dose Route Frequency Provider Last Rate Last Dose  . acetaminophen (TYLENOL) tablet 650 mg  650 mg Oral Q6H PRN Oralia Manis, MD       Or  . acetaminophen (TYLENOL) suppository 650 mg  650 mg Rectal Q6H PRN Oralia Manis, MD      . apixaban Everlene Balls) tablet 2.5 mg  2.5 mg Oral BID Oralia Manis, MD   2.5 mg at 09/24/15 1003  . aspirin chewable tablet 81 mg  81 mg Oral Daily Oralia Manis, MD   81 mg at 09/24/15 1003  . levothyroxine (SYNTHROID, LEVOTHROID) tablet 150 mcg  150 mcg Oral QAC breakfast Wyatt Haste, MD   150 mcg at 09/24/15 0559  . ondansetron (ZOFRAN) tablet 4 mg  4 mg Oral Q6H PRN Oralia Manis, MD       Or  . ondansetron [facility]) injection 4 mg  4 mg Intravenous Q6H PRN Oralia Manis, MD      . pantoprazole (PROTONIX) EC tablet 40 mg  40 mg Oral Daily Oralia Manis, MD   40 mg at 09/24/15 1003  . simvastatin (ZOCOR) tablet 20 mg  20 mg Oral QHS Onalee Hua  Anne Hahn, MD   20 mg at 09/23/15 2105  . sodium chloride flush (NS) 0.9 % injection 3 mL  3 mL Intravenous Q12H Oralia Manis, MD   3 mL at 09/24/15 1000     Discharge Medications: Please see discharge summary for a list of discharge medications.  Relevant Imaging Results:  Relevant Lab Results:   Additional Information  (SSN [id number])  Verta Ellen Enedelia Martorelli, LCSW

## 2015-09-24 NOTE — Evaluation (Signed)
Physical Therapy Evaluation Patient Details Name: Dennis Rice MRN: [id number] DOB: 12/17/1923 Today's Date: 09/24/2015   History of Present Illness  Patient is an 82 y/o male admitted for dyspnea on exertion, acute on chronic renal insufficiency. Currently with Foley placed.   Clinical Impression  Per patient report he has most recently been living at home with his son, using a RW for mobility. On this evaluation, patient is generally confused and requires PT to re-direct attempts at bed mobility, transfers, and use of RW in the room. Patient begins rapid, shallow breathing at several points in the session, though no O2 decrease noted. Patient responded well to cuing to increase depth of his breathing. Patient fatigues quite quickly, and requires significant assistance with sit to stand transfer from commode, secondary to weakness. Moderate command following throughout session, though he is unable to follow all commands. Given his poor insight and high risk of falling based on gait performance, would recommend STR to facilitate safe return home when medically appropriate for discharge.     Follow Up Recommendations SNF    Equipment Recommendations  Rolling walker with 5" wheels    Recommendations for Other Services       Precautions / Restrictions Precautions Precautions: Fall Restrictions Weight Bearing Restrictions: No      Mobility  Bed Mobility Overal bed mobility: Needs Assistance Bed Mobility: Supine to Sit     Supine to sit: Mod assist;Max assist     General bed mobility comments: Patient made several attempts to exit other side of bed, finally requiring PT to assist, decreased trunkal strength noted as patient required significant assistance to raise from bed surface.   Transfers Overall transfer level: Needs assistance Equipment used: Rolling walker (2 wheeled) Transfers: Sit to/from Stand Sit to Stand: Min guard;Max assist         General transfer comment:  Min guard assist, RW, cuing and prolonged time for bed to stand transfer, however from commode patient requires max A x1. See other exercises.   Ambulation/Gait   Ambulation Distance (Feet): 20 Feet Assistive device: Rolling walker (2 wheeled) Gait Pattern/deviations: Step-through pattern;Decreased step length - right;Decreased step length - left;Trunk flexed   Gait velocity interpretation: Below normal speed for age/gender General Gait Details: RW anterior to COM, flexed knees, flexed trunk even with cuing. Frequent rest breaks secondary to labored breathing, though O2 sats and HR remained stable and WNL. Decreased cadence, all indicating he is at high risk of falling.   Stairs            Wheelchair Mobility    Modified Rankin (Stroke Patients Only)       Balance Overall balance assessment: Needs assistance Sitting-balance support: No upper extremity supported;Feet supported Sitting balance-Leahy Scale: Fair     Standing balance support: Bilateral upper extremity supported Standing balance-Leahy Scale: Fair Standing balance comment: No overt loss of balance, no standardized test completed due to decreased stamina, command following deficits.                              Pertinent Vitals/Pain Pain Assessment: Faces Pain Score: 4  Pain Location: Abdominal pain, chronic.  Pain Descriptors / Indicators: Aching Pain Intervention(s): Limited activity within patient's tolerance;Monitored during session;Repositioned    Home Living Family/patient expects to be discharged to:: Private residence Living Arrangements: Children Available Help at Discharge: Available 24 hours/day Type of Home: House         Home Equipment:  Walker - 2 wheels      Prior Function Level of Independence: Independent with assistive device(s)         Comments: Patient is a poor historian and generally unable to provide much in the way of history. He does report he was using a RW  for ambulation with no falls recently prior to this, though it appears for very limited distances.      Hand Dominance        Extremity/Trunk Assessment   Upper Extremity Assessment: Generalized weakness           Lower Extremity Assessment: Generalized weakness (No focal deficits, more generalized weakness)         Communication   Communication: No difficulties  Cognition Arousal/Alertness: Awake/alert Behavior During Therapy: WFL for tasks assessed/performed Overall Cognitive Status: History of cognitive impairments - at baseline (This appears to be his cognitive baseline given his age (able to report he is at [facility]), though no family present to confirm)                      General Comments      Exercises Other Exercises Other Exercises: Assisted patient with transfer to and from commode, required max A x1 for sit to stand from commode, also requires heavy vc's for stand to sit transfer.       Assessment/Plan    PT Assessment Patient needs continued PT services  PT Diagnosis Difficulty walking;Generalized weakness   PT Problem List Decreased strength;Decreased knowledge of use of DME;Decreased safety awareness;Decreased activity tolerance;Decreased balance;Cardiopulmonary status limiting activity;Decreased mobility  PT Treatment Interventions Gait training;DME instruction;Stair training;Therapeutic activities;Therapeutic exercise;Balance training   PT Goals (Current goals can be found in the Care Plan section) Acute Rehab PT Goals Patient Stated Goal: To increase his stamina  PT Goal Formulation: With patient Time For Goal Achievement: 10/08/15 Potential to Achieve Goals: Fair    Frequency Min 2X/week   Barriers to discharge Inaccessible home environment      Co-evaluation               End of Session Equipment Utilized During Treatment: Gait belt Activity Tolerance: Patient tolerated treatment well;Patient limited by  fatigue Patient left: in chair;with call bell/phone within reach;with chair alarm set Nurse Communication: Mobility status         Time: 9147-8295 PT Time Calculation (min) (ACUTE ONLY): 25 min   Charges:   PT Evaluation $PT Eval Moderate Complexity: 1 Procedure PT Treatments $Therapeutic Activity: 8-22 mins   PT G Codes:       Kerin Ransom, PT, DPT    09/24/2015, 5:45 PM

## 2015-09-24 NOTE — Progress Notes (Signed)
  SUBJECTIVE: Patient is sitting up in chair dozing. He says that he is feeling much better. He denies chest pain. He has a foley in for accurate output and is on 1200 ml fluid restriction.   Filed Vitals:   09/23/15 2108 09/24/15 0343 09/24/15 0500 09/24/15 1133  BP: 134/89 115/77  150/83  Pulse: 66 71  61  Temp: 98.2 F (36.8 C) 98.2 F (36.8 C)  97.3 F (36.3 C)  TempSrc: Oral Oral  Oral  Resp: Height:      Weight:   198 lb 6.4 oz (89.994 kg)   SpO2: 95% 93%      Intake/Output Summary (Last 24 hours) at 09/24/15 1355 Last data filed at 09/24/15 0735  Gross per 24 hour  Intake    120 ml  Output   1200 ml  Net  -1080 ml    LABS: Basic Metabolic Panel:  Recent Labs  16/10/96 0514 09/23/15 0530 09/24/15 0613  NA 136 138  --   K 4.2 4.2  --   CL 104 105  --   CO2 24 26  --   GLUCOSE 105* 90  --   BUN 75* 70*  --   CREATININE 4.05* 3.41* 2.63*  CALCIUM 9.2 9.1  --    Liver Function Tests:  Recent Labs  09/21/15 1854  AST 40  ALT 18  ALKPHOS 155*  BILITOT 1.5*  PROT 6.8  ALBUMIN 3.5   No results for input(s): LIPASE, AMYLASE in the last 72 hours. CBC:  Recent Labs  09/22/15 0514 09/24/15 0613  WBC 4.9 6.9  HGB 11.4* 11.6*  HCT 35.5* 36.1*  MCV 78.6* 78.6*  PLT 169 181   Cardiac Enzymes:  Recent Labs  09/21/15 2328 09/22/15 0514 09/22/15 1045  TROPONINI 0.22* 0.24* 0.21*   BNP: Invalid input(s): POCBNP D-Dimer: No results for input(s): DDIMER in the last 72 hours. Hemoglobin A1C: No results for input(s): HGBA1C in the last 72 hours. Fasting Lipid Panel: No results for input(s): CHOL, HDL, LDLCALC, TRIG, CHOLHDL, LDLDIRECT in the last 72 hours. Thyroid Function Tests: No results for input(s): TSH, T4TOTAL, T3FREE, THYROIDAB in the last 72 hours.  Invalid input(s): FREET3 Anemia Panel: No results for input(s): VITAMINB12, FOLATE, FERRITIN, TIBC, IRON, RETICCTPCT in the last 72 hours.   PHYSICAL EXAM General: Pt is  sleepy,  no acute distress HEENT:  Normocephalic and atramatic Neck:  mild JVD.  Lungs: Clear bilaterally to auscultation Heart: HRRR . Normal S1 and S2 without gallops or murmurs.  Extremities: 1+ pedal edema Neuro: Alert and oriented X 3. Psych:  Good affect, responds appropriately  TELEMETRY: atrial fib with controlled rate and occ PVCs  ASSESSMENT AND PLAN: Acute on chronic renal failure improving with kidney function approaching baseline. Continuing to hold diuretics. Acute on chronic CHF, patient with mild bilat JVD and 1+ pedal edema, no shortness of breath at rest, no chest pain. He is no longer confused and has no complaints. Continue current care.  Principal Problem:   Acute on chronic renal failure (HCC) Active Problems:   GERD (gastroesophageal reflux disease)   Oliguria   BPH (benign prostatic hyperplasia)   HTN (hypertension)   OSA on CPAP   Acute on chronic systolic CHF (congestive heart failure) (HCC)    Berton Bon, NP 09/24/2015 1:55 PM

## 2015-09-24 NOTE — Progress Notes (Signed)
[facility]  ROUNDING NOTE   Subjective:  Renal function continues to improve. Creatinine currently down to 2.6. Still appears quite weak. Urine output 1.6 L over the past 3 shifts.  Objective:  Vital signs in last 24 hours:  Temp:  [97.6 F (36.4 C)-98.2 F (36.8 C)] 98.2 F (36.8 C) (03/01 0343) Pulse Rate:  [66-74] 71 (03/01 0343) Resp:  [18-20] 20 (03/01 0343) BP: (115-134)/(60-89) 115/77 mmHg (03/01 0343) SpO2:  [93 %-95 %] 93 % (03/01 0343) Weight:  [89.994 kg (198 lb 6.4 oz)] 89.994 kg (198 lb 6.4 oz) (03/01 0500)  Weight change: -2.223 kg (-4 lb 14.4 oz) Filed Weights   09/22/15 0651 09/23/15 0500 09/24/15 0500  Weight: 91.264 kg (201 lb 3.2 oz) 92.216 kg (203 lb 4.8 oz) 89.994 kg (198 lb 6.4 oz)    Intake/Output: I/O last 3 completed shifts: In: 78 [P.O.:480; I.V.:3] Out: 1650 [Urine:1650]   Intake/Output this shift:  Total I/O In: 120 [P.O.:120] Out: -   Physical Exam: General: NAD, resting in bed  Head: Normocephalic, atraumatic. Moist oral mucosal membranes  Eyes: Anicteric  Neck: Supple, trachea midline  Lungs:  Basilar rales, normal effort   Heart: S1S2 no obvious rubs  Abdomen:  Soft, nontender, BS present  Extremities: 2+ peripheral edema.  Neurologic: Nonfocal, moving all four extremities  Skin: No lesions       Basic Metabolic Panel:  Recent Labs Lab 09/21/15 1854 09/22/15 0514 09/23/15 0530 09/24/15 0613  NA 136 136 138  --   K 4.0 4.2 4.2  --   CL 103 104 105  --   CO2 21* 24 26  --   GLUCOSE 112* 105* 90  --   BUN 74* 75* 70*  --   CREATININE 4.11* 4.05* 3.41* 2.63*  CALCIUM 9.4 9.2 9.1  --     Liver Function Tests:  Recent Labs Lab 09/21/15 1854  AST 40  ALT 18  ALKPHOS 155*  BILITOT 1.5*  PROT 6.8  ALBUMIN 3.5   No results for input(s): LIPASE, AMYLASE in the last 168 hours. No results for input(s): AMMONIA in the last 168 hours.  CBC:  Recent Labs Lab 09/21/15 1854 09/22/15 0514  09/24/15 0613  WBC 5.4 4.9 6.9  HGB 12.0* 11.4* 11.6*  HCT 38.2* 35.5* 36.1*  MCV 80.1 78.6* 78.6*  PLT 180 169 181    Cardiac Enzymes:  Recent Labs Lab 09/21/15 1854 09/21/15 2328 09/22/15 0514 09/22/15 1045  TROPONINI 0.22* 0.22* 0.24* 0.21*    BNP: Invalid input(s): POCBNP  CBG: No results for input(s): GLUCAP in the last 168 hours.  Microbiology: No results found for this or any previous visit.  Coagulation Studies: No results for input(s): LABPROT, INR in the last 72 hours.  Urinalysis: No results for input(s): COLORURINE, LABSPEC, PHURINE, GLUCOSEU, HGBUR, BILIRUBINUR, KETONESUR, PROTEINUR, UROBILINOGEN, NITRITE, LEUKOCYTESUR in the last 72 hours.  Invalid input(s): APPERANCEUR    Imaging: No results found.   Medications:     . apixaban  2.5 mg Oral BID  . aspirin  81 mg Oral Daily  . levothyroxine  150 mcg Oral QAC breakfast  . pantoprazole  40 mg Oral Daily  . simvastatin  20 mg Oral QHS  . sodium chloride flush  3 mL Intravenous Q12H   acetaminophen **OR** acetaminophen, ondansetron **OR** ondansetron (ZOFRAN) IV  Assessment/ Plan:  82 y.o. male of chronic kidney disease stage III, chronic systolic heart failure ejection fraction 30-35%, coronary artery disease, GERD, hypertension, chronic constipation,  hyperlipidemia, and anxiety admitted with shortness of breath, increasing lower extremity edema in the setting of known systolic heart failure.  1. Acute renal failure/chronic kidney disease stage IV baseline EGFR 27. We follow the patient for underlying chronic kidney disease. Baseline EGFR was 27 from January in our office. Renal ultrasound was negative for obstruction. Acute renal failure now could be secondary to altered cardiorenal hemodynamics.  - Diuretics remain on hold and creatinine has now trended down to 2.63. Continue to hold diuretics for now and monitor renal function daily. Good urine output noted.  2. Acute on chronic systolic heart  failure.  BNP was elevated upon admission. Renal failure could have been contributing to this. Good urine output noted as above. Continue to hold diuretic therapy for now but may need to reinstate upon discharge.  3. Anemia of chronic kidney disease. Hemoglobin currently 11.6. No indication for procrit.  Continue to periodically monitor hgb.    LOS: 3 Juandedios Dudash 3/1/201711:32 AM

## 2015-09-25 LAB — BASIC METABOLIC PANEL
ANION GAP: 7 (ref 5–15)
BUN: 55 mg/dL — AB (ref 6–20)
CHLORIDE: 104 mmol/L (ref 101–111)
CO2: 25 mmol/L (ref 22–32)
Calcium: 9 mg/dL (ref 8.9–10.3)
Creatinine, Ser: 2.21 mg/dL — ABNORMAL HIGH (ref 0.61–1.24)
GFR calc Af Amer: 29 mL/min — ABNORMAL LOW (ref >60)
GFR calc non Af Amer: 25 mL/min — ABNORMAL LOW (ref >60)
Glucose, Bld: 91 mg/dL (ref 65–99)
POTASSIUM: 4.1 mmol/L (ref 3.5–5.1)
SODIUM: 136 mmol/L (ref 135–145)

## 2015-09-25 NOTE — Progress Notes (Signed)
Continued care for patient. Patient is in bed resting, no complaints of pain. Family at bedside. Will continue to monitor patient Dennis Rice

## 2015-09-25 NOTE — Progress Notes (Signed)
[facility] -  at [facility]   PATIENT NAME: Dennis Rice    MR#:  [id number]  DATE OF BIRTH:  Nov 23, 1923  SUBJECTIVE:  No complaints this morning, denies shortness of breath  REVIEW OF SYSTEMS:  CONSTITUTIONAL: No fever, fatigue or weakness.  EYES: No blurred or double vision.  EARS, NOSE, AND THROAT: No tinnitus or ear pain.  RESPIRATORY: No cough, shortness of breath, wheezing or hemoptysis.  CARDIOVASCULAR: No chest pain, orthopnea, positive edema.  GASTROINTESTINAL: No nausea, vomiting, diarrhea or abdominal pain.  GENITOURINARY: No dysuria, hematuria.  ENDOCRINE: No polyuria, nocturia,  HEMATOLOGY: No anemia, easy bruising or bleeding SKIN: No rash or lesion. MUSCULOSKELETAL: No joint pain or arthritis.   NEUROLOGIC: No tingling, numbness, weakness.  PSYCHIATRY: No anxiety or depression.   DRUG ALLERGIES:  No Known Allergies  VITALS:  Blood pressure 123/80, pulse 80, temperature 98.1 F (36.7 C), temperature source Oral, resp. rate 18, height  (1.753 m), weight 198 lb 6.1 oz (89.986 kg), SpO2 99 %.  PHYSICAL EXAMINATION:  VITAL SIGNS: Filed Vitals:   09/25/15 0415 09/25/15 1227  BP: 132/91 123/80  Pulse: 75 80  Temp: 98.1 F (36.7 C) 98.1 F (36.7 C)  Resp: 20 18   GENERAL:82 y.o.male currently in no acute distress.  HEAD: Normocephalic, atraumatic.  EYES: Pupils equal, round, reactive to light. Extraocular muscles intact. No scleral icterus.  MOUTH: Dry mucosal membrane. Dentition intact. No abscess noted.  EAR, NOSE, THROAT: Clear without exudates. No external lesions.  NECK: Supple. No thyromegaly. No nodules. No JVD.  PULMONARY: Diminished without wheeze rails or rhonci. No use of accessory muscles, Good respiratory effort. good air entry bilaterally CHEST: Nontender to palpation.  CARDIOVASCULAR: S1 and S2. Regular rate and rhythm. No murmurs, rubs, or gallops. 1+ edema. Pedal pulses 2+ bilaterally.   GASTROINTESTINAL: Soft, nontender, nondistended. No masses. Positive bowel sounds. No hepatosplenomegaly.  MUSCULOSKELETAL: No swelling, clubbing, or edema. Range of motion full in all extremities.  NEUROLOGIC: Cranial nerves II through XII are intact. No gross focal neurological deficits. Sensation intact. Reflexes intact.  SKIN: No ulceration, lesions, rashes, or cyanosis. Skin warm and dry. Turgor intact.  PSYCHIATRIC: Mood, affect flat The patient is awake, alert and oriented x 3. Insight, judgment intact.      LABORATORY PANEL:   CBC  Recent Labs Lab 09/24/15 0613  WBC 6.9  HGB 11.6*  HCT 36.1*  PLT 181   ------------------------------------------------------------------------------------------------------------------  Chemistries   Recent Labs Lab 09/21/15 1854  09/25/15 0648  NA 136  < > 136  K 4.0  < > 4.1  CL 103  < > 104  CO2 21*  < > 25  GLUCOSE 112*  < > 91  BUN 74*  < > 55*  CREATININE 4.11*  < > 2.21*  CALCIUM 9.4  < > 9.0  AST 40  --   --   ALT 18  --   --   ALKPHOS 155*  --   --   BILITOT 1.5*  --   --   < > = values in this interval not displayed. ------------------------------------------------------------------------------------------------------------------  Cardiac Enzymes  Recent Labs Lab 09/22/15 1045  TROPONINI 0.21*   ------------------------------------------------------------------------------------------------------------------  RADIOLOGY:  No results found.  EKG:   Orders placed or performed during the hospital encounter of 09/21/15  . ED EKG  . ED EKG  . ED EKG  . ED EKG    ASSESSMENT AND PLAN:   82 year old African-American gentleman admitted 09/21/15 with  shortness of breath and edema found to have worsening renal function  1. Acute kidney injury: Nephrology consult appreciated,  Continued improvement, avoid nephrotoxic agents  2. Acute on chronic systolic congestive heart failure: Cardiology input appreciated  diuretics on hold for now 3. GERD without esophagitis PPI therapy 4. Hyperlipidemia unspecified statin therapy 5. Venous embolism prophylactic: Eliquis   Disposition: SNF, anticipate discharge tomorrow  All the records are reviewed and case discussed with Care Management/Social Workerr. Management plans discussed with the patient, family and they are in agreement.  CODE STATUS: Full  TOTAL TIME TAKING CARE OF THIS PATIENT: 28 minutes.   POSSIBLE D/C IN 1 DAYS, DEPENDING ON CLINICAL CONDITION.   Drury Ardizzone,  Mardi Mainland.D on 09/25/2015 at 3:34 PM  Between 7am to 6pm - Pager - [phone]  After 6pm: House Pager: - [phone]  [facility]  Office  [phone]  CC: Primary care physician; Derwood Kaplan, MD

## 2015-09-25 NOTE — Progress Notes (Signed)
[facility]  ROUNDING NOTE   Subjective:  Renal function continues to improve. Creatinine currently down to 2.21. Still appears quite weak. Urine output 1.1 L Asking about foley  Objective:  Vital signs in last 24 hours:  Temp:  [97.8 F (36.6 C)-98.1 F (36.7 C)] 98.1 F (36.7 C) (03/02 1227) Pulse Rate:  [75-86] 80 (03/02 1227) Resp:  [18-20] 18 (03/02 1227) BP: (123-152)/(80-93) 123/80 mmHg (03/02 1227) SpO2:  [93 %-100 %] 99 % (03/02 1227) Weight:  [89.986 kg (198 lb 6.1 oz)] 89.986 kg (198 lb 6.1 oz) (03/02 0415)  Weight change: -0.008 kg (-0.3 oz) Filed Weights   09/23/15 0500 09/24/15 0500 09/25/15 0415  Weight: 92.216 kg (203 lb 4.8 oz) 89.994 kg (198 lb 6.4 oz) 89.986 kg (198 lb 6.1 oz)    Intake/Output: I/O last 3 completed shifts: In: 120 [P.O.:120] Out: 2300 [Urine:2300]   Intake/Output this shift:  Total I/O In: 720 [P.O.:720] Out: 360 [Urine:360]  Physical Exam: General: NAD, resting in bed  Head: Normocephalic, atraumatic. Moist oral mucosal membranes  Eyes: Anicteric  Neck: Supple, trachea midline  Lungs:  Basilar rales, normal effort   Heart: S1S2 no obvious rubs  Abdomen:  Soft, nontender, BS present  Extremities: 2+ peripheral edema.  Neurologic: Nonfocal, moving all four extremities  Skin: No lesions       Basic Metabolic Panel:  Recent Labs Lab 09/21/15 1854 09/22/15 0514 09/23/15 0530 09/24/15 0613 09/25/15 0648  NA 136 136 138  --  136  K 4.0 4.2 4.2  --  4.1  CL 103 104 105  --  104  CO2 21* 24 26  --  25  GLUCOSE 112* 105* 90  --  91  BUN 74* 75* 70*  --  55*  CREATININE 4.11* 4.05* 3.41* 2.63* 2.21*  CALCIUM 9.4 9.2 9.1  --  9.0    Liver Function Tests:  Recent Labs Lab 09/21/15 1854  AST 40  ALT 18  ALKPHOS 155*  BILITOT 1.5*  PROT 6.8  ALBUMIN 3.5   No results for input(s): LIPASE, AMYLASE in the last 168 hours. No results for input(s): AMMONIA in the last 168 hours.  CBC:  Recent  Labs Lab 09/21/15 1854 09/22/15 0514 09/24/15 0613  WBC 5.4 4.9 6.9  HGB 12.0* 11.4* 11.6*  HCT 38.2* 35.5* 36.1*  MCV 80.1 78.6* 78.6*  PLT 180 169 181    Cardiac Enzymes:  Recent Labs Lab 09/21/15 1854 09/21/15 2328 09/22/15 0514 09/22/15 1045  TROPONINI 0.22* 0.22* 0.24* 0.21*    BNP: Invalid input(s): POCBNP  CBG: No results for input(s): GLUCAP in the last 168 hours.  Microbiology: No results found for this or any previous visit.  Coagulation Studies: No results for input(s): LABPROT, INR in the last 72 hours.  Urinalysis: No results for input(s): COLORURINE, LABSPEC, PHURINE, GLUCOSEU, HGBUR, BILIRUBINUR, KETONESUR, PROTEINUR, UROBILINOGEN, NITRITE, LEUKOCYTESUR in the last 72 hours.  Invalid input(s): APPERANCEUR    Imaging: No results found.   Medications:     . apixaban  2.5 mg Oral BID  . aspirin  81 mg Oral Daily  . levothyroxine  150 mcg Oral QAC breakfast  . pantoprazole  40 mg Oral Daily  . simvastatin  20 mg Oral QHS   acetaminophen **OR** acetaminophen, ondansetron **OR** ondansetron (ZOFRAN) IV, sodium chloride flush  Assessment/ Plan:  82 y.o. male of chronic kidney disease stage III, chronic systolic heart failure ejection fraction 30-35%, coronary artery disease, GERD, hypertension, chronic constipation, hyperlipidemia, and  anxiety admitted with shortness of breath, increasing lower extremity edema in the setting of known systolic heart failure.  1. Acute renal failure/chronic kidney disease stage IV baseline EGFR 27. We follow the patient for underlying chronic kidney disease. Baseline EGFR was 27 from January in our office. Renal ultrasound was negative for obstruction. Acute renal failure now could be secondary to altered cardiorenal hemodynamics.  - Diuretics remain on hold and creatinine has now trended down to 2.2. Continue to hold diuretics for now and monitor renal function daily. Good urine output noted.  2. Acute on chronic  systolic heart failure.  BNP was elevated upon admission. Renal failure could have been contributing to this. Good urine output noted as above. Continue to hold diuretic therapy for now but may need to reinstate upon discharge.  3. Anemia of chronic kidney disease. Hemoglobin currently 11.6. No indication for procrit.  Continue to periodically monitor hgb.   4. Evaluate continued need for foley ?   LOS: 4 Cayden Granholm 3/2/20174:57 PM

## 2015-09-25 NOTE — Progress Notes (Signed)
SUBJECTIVE: Patient is feeling much better   Filed Vitals:   09/24/15 0500 09/24/15 1133 09/24/15 2108 09/25/15 0415  BP:  150/83 152/93 132/91  Pulse:  61 86 75  Temp:  97.3 F (36.3 C) 97.8 F (36.6 C) 98.1 F (36.7 C)  TempSrc:  Oral Oral   Resp:  Height:      Weight: 198 lb 6.4 oz (89.994 kg)   198 lb 6.1 oz (89.986 kg)  SpO2:   93% 100%    Intake/Output Summary (Last 24 hours) at 09/25/15 0932 Last data filed at 09/25/15 0415  Gross per 24 hour  Intake      0 ml  Output   1100 ml  Net  -1100 ml    LABS: Basic Metabolic Panel:  Recent Labs  16/10/96 0530 09/24/15 0613 09/25/15 0648  NA 138  --  136  K 4.2  --  4.1  CL 105  --  104  CO2 26  --  25  GLUCOSE 90  --  91  BUN 70*  --  55*  CREATININE 3.41* 2.63* 2.21*  CALCIUM 9.1  --  9.0   Liver Function Tests: No results for input(s): AST, ALT, ALKPHOS, BILITOT, PROT, ALBUMIN in the last 72 hours. No results for input(s): LIPASE, AMYLASE in the last 72 hours. CBC:  Recent Labs  09/24/15 0613  WBC 6.9  HGB 11.6*  HCT 36.1*  MCV 78.6*  PLT 181   Cardiac Enzymes:  Recent Labs  09/22/15 1045  TROPONINI 0.21*   BNP: Invalid input(s): POCBNP D-Dimer: No results for input(s): DDIMER in the last 72 hours. Hemoglobin A1C: No results for input(s): HGBA1C in the last 72 hours. Fasting Lipid Panel: No results for input(s): CHOL, HDL, LDLCALC, TRIG, CHOLHDL, LDLDIRECT in the last 72 hours. Thyroid Function Tests: No results for input(s): TSH, T4TOTAL, T3FREE, THYROIDAB in the last 72 hours.  Invalid input(s): FREET3 Anemia Panel: No results for input(s): VITAMINB12, FOLATE, FERRITIN, TIBC, IRON, RETICCTPCT in the last 72 hours.   PHYSICAL EXAM General: Well developed, well nourished, in no acute distress HEENT:  Normocephalic and atramatic Neck:  No JVD.  Lungs: Clear bilaterally to auscultation and percussion. Heart: HRRR . Normal S1 and S2 without gallops or murmurs.  Abdomen:  Bowel sounds are positive, abdomen soft and non-tender  Msk:  Back normal, normal gait. Normal strength and tone for age. Extremities: No clubbing, cyanosis or edema.   Neuro: Alert and oriented X 3. Psych:  Good affect, responds appropriately  TELEMETRY: A. fib  ASSESSMENT AND PLAN: CHF with severe LV dysfunction. Patient is improving.  Principal Problem:   Acute on chronic renal failure (HCC) Active Problems:   GERD (gastroesophageal reflux disease)   Oliguria   BPH (benign prostatic hyperplasia)   HTN (hypertension)   OSA on CPAP   Acute on chronic systolic CHF (congestive heart failure) (HCC)    Kyleah Pensabene A, MD, [facility] 09/25/2015 9:32 AM

## 2015-09-25 NOTE — Progress Notes (Signed)
CSW went by patient's room to present bed offers. Patient was asleep and no one was at bedside. CSW called patient's sonAllyson Sabal [phone]. Reported that the voicemail box was full. CSW sent a page with my number. CSW called patient's daughter-in-law Jasmine December [phone]. She reports that she preferenced [facility]. CSW called Jomarie Longs at [facility] and he's able to extend patient a bed. CSW informed Jasmine December.  Patient's son Allyson Sabal came and found CSW. CSW presented bed offers. Accepted a bed at [facility]. CSW informed Jomarie Longs- Admissions Coordinator at [facility]. CSW will continue to follow and assist.  Woodroe Mode, MSW, LCSW-A Clinical Social Work Department [phone]

## 2015-09-25 NOTE — Progress Notes (Signed)
Physical Therapy Treatment Patient Details Name: Dennis Rice MRN: [id number] DOB: Aug 21, 1923 Today's Date: 09/25/2015    History of Present Illness Patient is an 82 y/o male admitted for dyspnea on exertion, acute on chronic renal insufficiency. Currently with Foley placed.     PT Comments    Pt demonstrates mild improvement in bed mobility and transfers on this date. He is still acutely weak in LEs with transfers. Overall poor safety awareness with continual cues for safe proximity to walker during ambulation. Pt becomes fatigued during ambulation and requires seated rest break. However he is able to increase his ambulation distance. Unable to obtain SaO2 reading. Pt with small increase in respiration rate but no signs of significant respiratory distress. Will continue to progress ambulation distance in further PT sessions. Pt will benefit from skilled PT services to address deficits in strength, balance, and mobility in order to return to full function at home.    Follow Up Recommendations  SNF     Equipment Recommendations  Rolling walker with 5" wheels    Recommendations for Other Services       Precautions / Restrictions Precautions Precautions: Fall Restrictions Weight Bearing Restrictions: No    Mobility  Bed Mobility Overal bed mobility: Needs Assistance Bed Mobility: Supine to Sit     Supine to sit: Min assist     General bed mobility comments: Pt able to roll onto L side but takes extended time to complete. Requires heavy use of bed rails as well as assist from therapist to go from L sidelying to sitting  Transfers Overall transfer level: Needs assistance Equipment used: Rolling walker (2 wheeled) Transfers: Sit to/from Stand Sit to Stand: Min assist         General transfer comment: Pt demonstrates decreased LE strength/power requiring extended time to come to standing from both bed and BSC. Practiced transfer with patient from bed to [facility]. Pt provided  cues for safe hand placement as well as proximity to chair prior to descent. Pt stands with crouched gait posture requiring cues for upright stance and to extend knees  Ambulation/Gait Ambulation/Gait assistance: Min guard Ambulation Distance (Feet): 60 Feet (30+30) Assistive device: Rolling walker (2 wheeled) Gait Pattern/deviations: Decreased step length - right;Decreased step length - left;Shuffle Gait velocity: Decreased Gait velocity interpretation: <1.8 ft/sec, indicative of risk for recurrent falls General Gait Details: Decreased toe to floor clearance with short shuffling steps. RW anterior to center of mass with repeated cues to remain within confines of walker. Flexed trunk posture requiring cues to improve upright stance. Pt ambulates two bouts of 30' each with seated rest break. He reports fatigue and DOE but no overt signs of respiratory distress. Unable to obtain SaO2 reading on finger   Stairs            Wheelchair Mobility    Modified Rankin (Stroke Patients Only)       Balance Overall balance assessment: Needs assistance Sitting-balance support: No upper extremity supported Sitting balance-Leahy Scale: Fair     Standing balance support: Bilateral upper extremity supported Standing balance-Leahy Scale: Poor                      Cognition Arousal/Alertness: Awake/alert Behavior During Therapy: WFL for tasks assessed/performed Overall Cognitive Status: History of cognitive impairments - at baseline (This appears to be his cognitive baseline given his age (able to report he is at [facility]), though no family present to confirm)  Exercises General Exercises - Lower Extremity Ankle Circles/Pumps: Strengthening;Both;15 reps;Supine Quad Sets: Other (comment) (Pt unable to understand quad or glut sets) Heel Slides: Strengthening;Both;15 reps;Supine Hip ABduction/ADduction: Strengthening;Both;15  reps;Supine Straight Leg Raises: Strengthening;Both;15 reps;Supine    General Comments        Pertinent Vitals/Pain Pain Assessment: No/denies pain Pain Location: No pain reported. No grimacing or groaning during physical therapy Pain Intervention(s): Monitored during session    Home Living                      Prior Function            PT Goals (current goals can now be found in the care plan section) Acute Rehab PT Goals Patient Stated Goal: To increase his stamina  PT Goal Formulation: With patient Time For Goal Achievement: 10/08/15 Potential to Achieve Goals: Fair Progress towards PT goals: Progressing toward goals    Frequency  Min 2X/week    PT Plan Current plan remains appropriate    Co-evaluation             End of Session Equipment Utilized During Treatment: Gait belt Activity Tolerance: Patient limited by fatigue Patient left: in chair;with call bell/phone within reach;with chair alarm set;with family/visitor present     Time: 2595-6387 PT Time Calculation (min) (ACUTE ONLY): 38 min  Charges:  $Gait Training: 8-22 mins $Therapeutic Exercise: 8-22 mins                    G Codes:      Dennis Rice Dennis Rice PT, DPT   Dennis Rice 09/25/2015, 5:20 PM

## 2015-09-25 NOTE — Progress Notes (Signed)
cpap refused 

## 2015-09-25 NOTE — Care Management Important Message (Signed)
Important Message  Patient Details  Name: Dennis Rice MRN: [id number] Date of Birth: Feb 14, 1924   Medicare Important Message Given:  Yes    Olegario Messier A Tharon Kitch 09/25/2015, 10:57 AM

## 2015-09-26 MED ORDER — FUROSEMIDE 40 MG PO TABS: 40.0000 mg | ORAL_TABLET | Freq: Every day | ORAL | Status: AC

## 2015-09-26 NOTE — Progress Notes (Signed)
Orders to d/c pt to [facility] today. Foley removed and pt has since voided. Packet completed by CSW. [facility] and pt ready. Currently awaiting EMS for d/c and tele box removal.

## 2015-09-26 NOTE — Progress Notes (Signed)
Pt currently leaving unit via EMS. Tele D'c'd.

## 2015-09-26 NOTE — Progress Notes (Signed)
A&O. Up with assist. Rested well during the night. Foley in place. On RA.

## 2015-09-26 NOTE — Discharge Summary (Signed)
[facility] [place] at [facility]   PATIENT NAME: Dennis RutherfordWalter Rice    MR#:  [id number]  DATE OF BIRTH:  01/31/1924  DATE OF ADMISSION:  09/21/2015 ADMITTING PHYSICIAN: Oralia Manisavid Willis, MD  DATE OF DISCHARGE: No discharge date for patient encounter.  PRIMARY CARE PHYSICIAN: Derwood KaplanEason,  Ernest B, MD    ADMISSION DIAGNOSIS:  Oliguria [R34] Acute on chronic renal insufficiency (HCC) [N28.9, N18.9] Generalized weakness [R53.1]  DISCHARGE DIAGNOSIS:  Acute on chronic renal insufficiency Acute on chronic systolic congestive heart failure GERD without esophagitis  SECONDARY DIAGNOSIS:   Past Medical History  Diagnosis Date  . GERD (gastroesophageal reflux disease)   . PAD (peripheral artery disease) (HCC)   . Hypertension   . Coronary artery disease   . Back pain     Chronic  . BPH (benign prostatic hypertrophy)   . Kidney stones   . Hypothyroidism   . Hyperlipidemia   . OSA on CPAP     HOSPITAL COURSE:  Dennis RutherfordWalter Rice  is a 82 y.o. male admitted 09/21/2015 with chief complaint of shortness of breath and edema. Please see H&P performed by Dr. Anne HahnWillis for further information. Upon arrival patient was noted to be volume overloaded with signs and symptoms suggestive of acute on chronic systolic congestive heart failure however, also noted to have worsening kidney function from baseline. Cardiology, nephrology reconsult to aid in the above issues. Diuretics were put on hold given the careful fluid balance with renal issues. His kidney function has continually improved closer to baseline since arrival in the hospital. During the course of his stay he had a Foley catheter placed to aid in accurate urine output and diuresis-Foley removed prior to discharge  DISCHARGE CONDITIONS:   Stable  CONSULTS OBTAINED:  Treatment Team:  Laurier NancyShaukat A Khan, MD Munsoor Cherylann RatelLateef, MD  DRUG ALLERGIES:  No Known Allergies  DISCHARGE MEDICATIONS:   Current Discharge Medication List    CONTINUE these medications which have CHANGED   Details  furosemide (LASIX) 40 MG tablet Take 1 tablet (40 mg total) by mouth daily. Qty: 30 tablet, Refills: 0      CONTINUE these medications which have NOT CHANGED   Details  aspirin 81 MG chewable tablet Chew 81 mg by mouth daily.    calcitRIOL (ROCALTROL) 0.25 MCG capsule Take 0.25 mcg by mouth daily.    cimetidine (TAGAMET) 200 MG tablet Take 200 mg by mouth 2 (two) times daily.    enalapril (VASOTEC) 2.5 MG tablet Take 2.5 mg by mouth daily.    isosorbide-hydrALAZINE (BIDIL) 20-37.5 MG per tablet Take 1 tablet by mouth 3 (three) times daily.    metoprolol tartrate (LOPRESSOR) 25 MG tablet Take 12.5 mg by mouth 2 (two) times daily.     simvastatin (ZOCOR) 20 MG tablet Take 20 mg by mouth at bedtime.    SYNTHROID 150 MCG tablet Take 150 mcg by mouth daily.      STOP taking these medications     apixaban (ELIQUIS) 2.5 MG TABS tablet      pantoprazole (PROTONIX) 40 MG tablet          DISCHARGE INSTRUCTIONS:   decrease Lasix to once daily  DIET:  Cardiac diet  DISCHARGE CONDITION:  Stable  ACTIVITY:  Activity as tolerated  OXYGEN:  Home Oxygen: No.   Oxygen Delivery: room air  DISCHARGE LOCATION:  nursing home   If you experience worsening of your admission symptoms, develop shortness of breath, life threatening emergency, suicidal or homicidal thoughts  you must seek medical attention immediately by calling 911 or calling your MD immediately  if symptoms less severe.  You Must read complete instructions/literature along with all the possible adverse reactions/side effects for all the Medicines you take and that have been prescribed to you. Take any new Medicines after you have completely understood and accpet all the possible adverse reactions/side effects.   Please note  You were cared for by a hospitalist during your hospital stay. If you have any questions about your discharge medications or the care  you received while you were in the hospital after you are discharged, you can call the unit and asked to speak with the hospitalist on call if the hospitalist that took care of you is not available. Once you are discharged, your primary care physician will handle any further medical issues. Please note that NO REFILLS for any discharge medications will be authorized once you are discharged, as it is imperative that you return to your primary care physician (or establish a relationship with a primary care physician if you do not have one) for your aftercare needs so that they can reassess your need for medications and monitor your lab values.    On the day of Discharge:   VITAL SIGNS:  Blood pressure 122/80, pulse 79, temperature 97.5 F (36.4 C), temperature source Oral, resp. rate 21, height  (1.753 m), weight 90.357 kg (199 lb 3.2 oz), SpO2 95 %.  I/O:   Intake/Output Summary (Last 24 hours) at 09/26/15 1010 Last data filed at 09/26/15 0943  Gross per 24 hour  Intake    720 ml  Output   1235 ml  Net   -515 ml    PHYSICAL EXAMINATION:  GENERAL:  82 y.o.-year-old patient lying in the bed with no acute distress.  EYES: Pupils equal, round, reactive to light and accommodation. No scleral icterus. Extraocular muscles intact.  HEENT: Head atraumatic, normocephalic. Oropharynx and nasopharynx clear.  NECK:  Supple, no jugular venous distention. No thyroid enlargement, no tenderness.  LUNGS: Normal breath sounds bilaterally, no wheezing, rales,rhonchi or crepitation. No use of accessory muscles of respiration.  CARDIOVASCULAR: S1, S2 normal. No murmurs, rubs, or gallops. 1+ lower extremity edema ABDOMEN: Soft, non-tender, non-distended. Bowel sounds present. No organomegaly or mass.  EXTREMITIES: No pedal edema, cyanosis, or clubbing.  NEUROLOGIC: Cranial nerves II through XII are intact. Muscle strength 5/5 in all extremities. Sensation intact. Gait not checked.  PSYCHIATRIC: The  patient is alert and oriented x 3.  SKIN: No obvious rash, lesion, or ulcer.   DATA REVIEW:   CBC  Recent Labs Lab 09/24/15 0613  WBC 6.9  HGB 11.6*  HCT 36.1*  PLT 181    Chemistries   Recent Labs Lab 09/21/15 1854  09/25/15 0648  NA 136  < > 136  K 4.0  < > 4.1  CL 103  < > 104  CO2 21*  < > 25  GLUCOSE 112*  < > 91  BUN 74*  < > 55*  CREATININE 4.11*  < > 2.21*  CALCIUM 9.4  < > 9.0  AST 40  --   --   ALT 18  --   --   ALKPHOS 155*  --   --   BILITOT 1.5*  --   --   < > = values in this interval not displayed.  Cardiac Enzymes  Recent Labs Lab 09/22/15 1045  TROPONINI 0.21*    Microbiology Results  No results found for  this or any previous visit.  RADIOLOGY:  No results found.   Management plans discussed with the patient, family and they are in agreement.  CODE STATUS:     Code Status Orders        Start     Ordered   09/21/15 2303  Full code   Continuous     09/21/15 2302    Code Status History    Date Active Date Inactive Code Status Order ID Comments User Context   This patient has a current code status but no historical code status.    Advance Directive Documentation        Most Recent Value   Type of Advance Directive  Healthcare Power of Attorney   Pre-existing out of facility DNR order (yellow form or pink MOST form)     "MOST" Form in Place?        TOTAL TIME TAKING CARE OF THIS PATIENT: 33 minutes.    Hower,  Mardi Mainland.D on 09/26/2015 at 10:10 AM  Between 7am to 6pm - Pager - [phone]  After 6pm go to [email or web address] - password [email or web address] [facility]  [facility]  Office  [phone]  CC: Primary care physician; Derwood Kaplan, MD

## 2015-09-26 NOTE — Progress Notes (Signed)
SUBJECTIVE: Patient appears to be complaining of having pain at the catheter site   Filed Vitals:   09/25/15 0415 09/25/15 1227 09/25/15 1921 09/26/15 0457  BP: 132/91 123/80 122/73 122/80  Pulse: 75 80 78 79  Temp: 98.1 F (36.7 C) 98.1 F (36.7 C) 97.5 F (36.4 C) 97.5 F (36.4 C)  TempSrc:  Oral Oral Oral  Resp: 20 18 24 21   Height:      Weight: 198 lb 6.1 oz (89.986 kg)   199 lb 3.2 oz (90.357 kg)  SpO2: 100% 99% 99% 95%    Intake/Output Summary (Last 24 hours) at 09/26/15 0903 Last data filed at 09/26/15 0600  Gross per 24 hour  Intake    720 ml  Output   1010 ml  Net   -290 ml    LABS: Basic Metabolic Panel:  Recent Labs  16/04/9602/01/17 0613 09/25/15 0648  NA  --  136  K  --  4.1  CL  --  104  CO2  --  25  GLUCOSE  --  91  BUN  --  55*  CREATININE 2.63* 2.21*  CALCIUM  --  9.0   Liver Function Tests: No results for input(s): AST, ALT, ALKPHOS, BILITOT, PROT, ALBUMIN in the last 72 hours. No results for input(s): LIPASE, AMYLASE in the last 72 hours. CBC:  Recent Labs  09/24/15 0613  WBC 6.9  HGB 11.6*  HCT 36.1*  MCV 78.6*  PLT 181   Cardiac Enzymes: No results for input(s): CKTOTAL, CKMB, CKMBINDEX, TROPONINI in the last 72 hours. BNP: Invalid input(s): POCBNP D-Dimer: No results for input(s): DDIMER in the last 72 hours. Hemoglobin A1C: No results for input(s): HGBA1C in the last 72 hours. Fasting Lipid Panel: No results for input(s): CHOL, HDL, LDLCALC, TRIG, CHOLHDL, LDLDIRECT in the last 72 hours. Thyroid Function Tests: No results for input(s): TSH, T4TOTAL, T3FREE, THYROIDAB in the last 72 hours.  Invalid input(s): FREET3 Anemia Panel: No results for input(s): VITAMINB12, FOLATE, FERRITIN, TIBC, IRON, RETICCTPCT in the last 72 hours.   PHYSICAL EXAM General: Well developed, well nourished, in no acute distress HEENT:  Normocephalic and atramatic Neck:  No JVD.  Lungs: Clear bilaterally to auscultation and percussion. Heart: HRRR .  Normal S1 and S2 without gallops or murmurs.  Abdomen: Bowel sounds are positive, abdomen soft and non-tender  Msk:  Back normal, normal gait. Normal strength and tone for age. Extremities: No clubbing, cyanosis or edema.   Neuro: Alert and oriented X 3. Psych:  Good affect, responds appropriately  TELEMETRY: A. fib  ASSESSMENT AND PLAN: CHF with renal failure and obstructive sleep apnea. Patient has severe LV dysfunction on echocardiogram but is feeling better as far as shortness of breath. However patient is asking for removal of Foley catheter.  Principal Problem:   Acute on chronic renal failure (HCC) Active Problems:   GERD (gastroesophageal reflux disease)   Oliguria   BPH (benign prostatic hyperplasia)   HTN (hypertension)   OSA on CPAP   Acute on chronic systolic CHF (congestive heart failure) (HCC)    Dennis Rice A, MD, [facility] FoundationFACC 09/26/2015 9:03 AM

## 2015-09-26 NOTE — Progress Notes (Addendum)
Clinical Social Worker was informed that patient will be medically ready to discharge to [facility]. Patient and his family are in a agreement with plan. CSW called Jomarie LongsJoseph- Admissions Coordinator at [facility] to confirm that patient's bed is ready. Provided patient's room number 809 and number to call for report [phone] Selena Batten(Kim) . All discharge information faxed to [facility] via [facility].    Call to patient's son- Jasmine DecemberSharon- Daughter-in-law, to inform him that  patient would discharge to [facility]. RN will call report and patient will discharge to [facility].   Woodroe Modehristina Shani Fitch, MSW, LCSW-A Clinical Social Work Department [phone]

## 2015-09-26 NOTE — Clinical Social Work Placement (Signed)
   CLINICAL SOCIAL WORK PLACEMENT  NOTE  Date:  09/26/2015  Patient Details  Name: Dennis Rice MRN: [id number] Date of Birth: August 13, 1923  Clinical Social Work is seeking post-discharge placement for this patient at the Skilled  Nursing Facility level of care (*CSW will initial, date and re-position this form in  chart as items are completed):  Yes   Patient/family provided with [facility] Clinical Social Work Department's list of facilities offering this level of care within the geographic area requested by the patient (or if unable, by the patient's family).  Yes   Patient/family informed of their freedom to choose among providers that offer the needed level of care, that participate in Medicare, Medicaid or managed care program needed by the patient, have an available bed and are willing to accept the patient.  Yes   Patient/family informed of [facility]'s ownership interest [facility] and [facility], as well as of the fact that they are under no obligation to receive care at these facilities.  PASRR submitted to EDS on 09/26/15     PASRR number received on 09/26/15     Existing PASRR number confirmed on       FL2 transmitted to all facilities in geographic area requested by pt/family on 09/26/15     FL2 transmitted to all facilities within larger geographic area on       Patient informed that his/her managed care company has contracts with or will negotiate with certain facilities, including the following:        Yes   Patient/family informed of bed offers received.  Patient chooses bed at  ([facility])     Physician recommends and patient chooses bed at      Patient to be transferred to  ([facility]) on 09/26/15.  Patient [facility] facility by  [facility]     Patient family notified on 09/26/15 of transfer.  Name of family member notified:   [name]     PHYSICIAN       Additional Comment:     _______________________________________________ Idamae Lusherhristina E Colden Samaras, LCSW 09/26/2015, 2:17 PM

## 2015-10-03 ENCOUNTER — Encounter: Payer: Self-pay | Admitting: Family

## 2015-10-03 ENCOUNTER — Ambulatory Visit: Payer: Medicare (Managed Care) | Attending: Family | Admitting: Family

## 2015-10-03 VITALS — BP 105/67 | HR 71 | Resp 18 | Ht 69.0 in | Wt 183.0 lb

## 2015-10-03 DIAGNOSIS — Z87442 Personal history of urinary calculi: Secondary | ICD-10-CM | POA: Diagnosis not present

## 2015-10-03 DIAGNOSIS — Z7982 Long term (current) use of aspirin: Secondary | ICD-10-CM | POA: Diagnosis not present

## 2015-10-03 DIAGNOSIS — Z9889 Other specified postprocedural states: Secondary | ICD-10-CM | POA: Insufficient documentation

## 2015-10-03 DIAGNOSIS — I739 Peripheral vascular disease, unspecified: Secondary | ICD-10-CM | POA: Diagnosis not present

## 2015-10-03 DIAGNOSIS — I2510 Atherosclerotic heart disease of native coronary artery without angina pectoris: Secondary | ICD-10-CM | POA: Diagnosis not present

## 2015-10-03 DIAGNOSIS — K219 Gastro-esophageal reflux disease without esophagitis: Secondary | ICD-10-CM | POA: Diagnosis not present

## 2015-10-03 DIAGNOSIS — I5022 Chronic systolic (congestive) heart failure: Secondary | ICD-10-CM

## 2015-10-03 DIAGNOSIS — N400 Enlarged prostate without lower urinary tract symptoms: Secondary | ICD-10-CM | POA: Insufficient documentation

## 2015-10-03 DIAGNOSIS — E785 Hyperlipidemia, unspecified: Secondary | ICD-10-CM | POA: Insufficient documentation

## 2015-10-03 DIAGNOSIS — Z79899 Other long term (current) drug therapy: Secondary | ICD-10-CM | POA: Diagnosis not present

## 2015-10-03 DIAGNOSIS — R002 Palpitations: Secondary | ICD-10-CM | POA: Diagnosis not present

## 2015-10-03 DIAGNOSIS — M7989 Other specified soft tissue disorders: Secondary | ICD-10-CM | POA: Insufficient documentation

## 2015-10-03 DIAGNOSIS — I10 Essential (primary) hypertension: Secondary | ICD-10-CM | POA: Insufficient documentation

## 2015-10-03 DIAGNOSIS — G4733 Obstructive sleep apnea (adult) (pediatric): Secondary | ICD-10-CM | POA: Insufficient documentation

## 2015-10-03 DIAGNOSIS — E039 Hypothyroidism, unspecified: Secondary | ICD-10-CM | POA: Diagnosis not present

## 2015-10-03 NOTE — Patient Instructions (Signed)
Begin weighing daily and call for an overnight weight gain of > 2 pounds or a weekly weight gain of >5 pounds. 

## 2015-10-03 NOTE — Progress Notes (Signed)
Subjective:    Patient ID: Dennis Rice, male    DOB: 04-29-1924, 82 y.o.   MRN: [id number]  Congestive Heart Failure Presents for follow-up visit. The disease course has been stable. Associated symptoms include abdominal pain, edema, fatigue, palpitations (sometimes) and shortness of breath (sometimes). Pertinent negatives include no chest pain, chest pressure, muscle weakness or orthopnea. The symptoms have been stable. Past treatments include ACE inhibitors, beta blockers, salt and fluid restriction and oxygen. The treatment provided moderate relief. Compliance with prior treatments has been good. His past medical history is significant for CAD and HTN. There is no history of CVA or DM.  Hypertension This is a chronic problem. The current episode started more than 1 year ago. The problem is unchanged. The problem is controlled. Associated symptoms include palpitations (sometimes), peripheral edema and shortness of breath (sometimes). Pertinent negatives include no chest pain, headaches or neck pain. Agents associated with hypertension include thyroid hormones. Risk factors for coronary artery disease include dyslipidemia, male gender and sedentary lifestyle. Past treatments include ACE inhibitors, beta blockers, diuretics and lifestyle changes. The current treatment provides significant improvement. Compliance problems include exercise.  Hypertensive end-organ damage includes CAD/MI and heart failure.  Other This is a new (right hand swelling) problem. The current episode started more than 1 month ago. The problem occurs constantly. The problem has been unchanged. Associated symptoms include abdominal pain, coughing and fatigue. Pertinent negatives include no chest pain, congestion, headaches, nausea, neck pain, sore throat or weakness. Nothing aggravates the symptoms. He has tried nothing for the symptoms.    Past Medical History  Diagnosis Date  . GERD (gastroesophageal reflux disease)   .  PAD (peripheral artery disease) (HCC)   . Hypertension   . Coronary artery disease   . Back pain     Chronic  . BPH (benign prostatic hypertrophy)   . Kidney stones   . Hypothyroidism   . Hyperlipidemia   . OSA on CPAP     Past Surgical History  Procedure Laterality Date  . Cardiac catheterization    . Cataract extraction    . Lithotripsy      History reviewed. No pertinent family history.  Social History  Substance Use Topics  . Smoking status: Never Smoker   . Smokeless tobacco: Never Used  . Alcohol Use: No    No Known Allergies  Prior to Admission medications   Medication Sig Start Date End Date Taking? Authorizing Provider  aspirin 81 MG chewable tablet Chew 81 mg by mouth daily.   Yes Historical Provider, MD  calcitRIOL (ROCALTROL) 0.25 MCG capsule Take 0.25 mcg by mouth daily.   Yes Historical Provider, MD  cimetidine (TAGAMET) 200 MG tablet Take 200 mg by mouth 2 (two) times daily.   Yes Historical Provider, MD  enalapril (VASOTEC) 2.5 MG tablet Take 2.5 mg by mouth daily.   Yes Historical Provider, MD  furosemide (LASIX) 40 MG tablet Take 1 tablet (40 mg total) by mouth daily. 09/26/15  Yes Wyatt Haste, MD  isosorbide-hydrALAZINE (BIDIL) 20-37.5 MG per tablet Take 1 tablet by mouth 3 (three) times daily.   Yes Historical Provider, MD  metoprolol tartrate (LOPRESSOR) 25 MG tablet Take 12.5 mg by mouth 2 (two) times daily.    Yes Historical Provider, MD  simvastatin (ZOCOR) 20 MG tablet Take 20 mg by mouth at bedtime.   Yes Historical Provider, MD  SYNTHROID 150 MCG tablet Take 150 mcg by mouth daily.   Yes Historical Provider, MD  Review of Systems  Constitutional: Positive for fatigue. Negative for appetite change.  HENT: Negative for congestion, postnasal drip and sore throat.   Eyes: Negative.   Respiratory: Positive for cough and shortness of breath (sometimes). Negative for chest tightness and wheezing.   Cardiovascular: Positive for palpitations  (sometimes) and leg swelling. Negative for chest pain.  Gastrointestinal: Positive for abdominal pain. Negative for nausea and abdominal distention.  Endocrine: Negative.   Genitourinary: Negative.   Musculoskeletal: Negative for back pain, muscle weakness and neck pain.       Right hand is swollen  Skin: Negative.   Allergic/Immunologic: Negative.   Neurological: Positive for light-headedness. Negative for dizziness, weakness and headaches.  Hematological: Negative for adenopathy. Does not bruise/bleed easily.  Psychiatric/Behavioral: Negative for sleep disturbance (sleeping on 2 pillows) and dysphoric mood. The patient is nervous/anxious.        Objective:   Physical Exam  Constitutional: He appears well-developed and well-nourished.  HENT:  Head: Normocephalic and atraumatic.  Eyes: Conjunctivae are normal. Right eye exhibits no discharge. Left eye exhibits no discharge.  Neck: Normal range of motion. Neck supple.  Cardiovascular: Normal rate and regular rhythm.   Pulmonary/Chest: Effort normal. He has no wheezes. He has no rales.  Abdominal: Soft. He exhibits no distension. There is no tenderness.  Musculoskeletal: He exhibits edema (1+ pitting edema in bilateral lower legs). He exhibits no tenderness.       Right wrist: He exhibits swelling.  Neurological: He is alert. He displays no tremor.  Skin: Skin is warm and dry.  Psychiatric: He has a normal mood and affect. His behavior is normal.  Nursing note and vitals reviewed.   BP 105/67 mmHg  Pulse 71  Resp 18  Ht 5\' 9"  (1.753 m)  Wt 183 lb (83.008 kg)  BMI 27.01 kg/m2  SpO2 98%       Assessment & Plan:  1: Chronic heart failure with reduced ejection fraction- Patient presents with fatigue upon exertion. He also endorses getting short of breath on occasion. Does wear his oxygen at 2L around the clock and says that that helps quite a bit. He came into the office in a wheelchair because he says that it would have been  too far to walk. Is getting weighed on a weekly basis and an order was written for him to get weighed on a daily basis and to call for an overnight weight gain of >2 pounds or a weekly weight gain of >5 pounds. He does not add salt to his food and the caregiver with him says that she doesn't think [facility] cooks with salt either. Both of his lower legs have some swelling and he does have his TED hose on. He says that he tries to elevate them during the day when he can. May consider changing his enalapril to entresto at his next office visit. Could also consider increasing his metoprolol as well. 2: HTN- Blood pressure looks good today. Continue medications at this time. 3: Right hand swelling- Right hand is swollen around the wrist and all the way into his fingers. He denies any pain to the hand. No numbness and good radial pulse. This is caregivers first time with the patient so she has no idea how long it's been swollen and patient doesn't know either. Order written for the PCP at the facility to take a look at it today when he comes out.  Return here in 1 month or sooner for any questions/problems before then.

## 2015-10-19 ENCOUNTER — Encounter: Payer: Self-pay | Admitting: Emergency Medicine

## 2015-10-19 ENCOUNTER — Inpatient Hospital Stay
Admission: EM | Admit: 2015-10-19 | Discharge: 2015-10-25 | DRG: 291 | Disposition: E | Payer: Medicare Other | Attending: Internal Medicine | Admitting: Internal Medicine

## 2015-10-19 ENCOUNTER — Emergency Department: Payer: Medicare Other

## 2015-10-19 DIAGNOSIS — I4891 Unspecified atrial fibrillation: Secondary | ICD-10-CM | POA: Diagnosis present

## 2015-10-19 DIAGNOSIS — N184 Chronic kidney disease, stage 4 (severe): Secondary | ICD-10-CM | POA: Diagnosis present

## 2015-10-19 DIAGNOSIS — Z79899 Other long term (current) drug therapy: Secondary | ICD-10-CM

## 2015-10-19 DIAGNOSIS — Z66 Do not resuscitate: Secondary | ICD-10-CM | POA: Diagnosis not present

## 2015-10-19 DIAGNOSIS — N049 Nephrotic syndrome with unspecified morphologic changes: Secondary | ICD-10-CM | POA: Diagnosis not present

## 2015-10-19 DIAGNOSIS — G8929 Other chronic pain: Secondary | ICD-10-CM | POA: Diagnosis present

## 2015-10-19 DIAGNOSIS — E785 Hyperlipidemia, unspecified: Secondary | ICD-10-CM | POA: Diagnosis present

## 2015-10-19 DIAGNOSIS — T501X5A Adverse effect of loop [high-ceiling] diuretics, initial encounter: Secondary | ICD-10-CM | POA: Diagnosis present

## 2015-10-19 DIAGNOSIS — I509 Heart failure, unspecified: Secondary | ICD-10-CM | POA: Diagnosis not present

## 2015-10-19 DIAGNOSIS — I739 Peripheral vascular disease, unspecified: Secondary | ICD-10-CM | POA: Diagnosis present

## 2015-10-19 DIAGNOSIS — I959 Hypotension, unspecified: Secondary | ICD-10-CM | POA: Diagnosis present

## 2015-10-19 DIAGNOSIS — R600 Localized edema: Secondary | ICD-10-CM | POA: Diagnosis not present

## 2015-10-19 DIAGNOSIS — E872 Acidosis: Secondary | ICD-10-CM | POA: Diagnosis present

## 2015-10-19 DIAGNOSIS — Z9849 Cataract extraction status, unspecified eye: Secondary | ICD-10-CM

## 2015-10-19 DIAGNOSIS — N19 Unspecified kidney failure: Secondary | ICD-10-CM

## 2015-10-19 DIAGNOSIS — Z515 Encounter for palliative care: Secondary | ICD-10-CM | POA: Diagnosis present

## 2015-10-19 DIAGNOSIS — N400 Enlarged prostate without lower urinary tract symptoms: Secondary | ICD-10-CM | POA: Diagnosis present

## 2015-10-19 DIAGNOSIS — R310 Gross hematuria: Secondary | ICD-10-CM | POA: Diagnosis present

## 2015-10-19 DIAGNOSIS — I272 Other secondary pulmonary hypertension: Secondary | ICD-10-CM | POA: Diagnosis present

## 2015-10-19 DIAGNOSIS — R34 Anuria and oliguria: Secondary | ICD-10-CM | POA: Diagnosis present

## 2015-10-19 DIAGNOSIS — Z9889 Other specified postprocedural states: Secondary | ICD-10-CM

## 2015-10-19 DIAGNOSIS — I2510 Atherosclerotic heart disease of native coronary artery without angina pectoris: Secondary | ICD-10-CM | POA: Diagnosis present

## 2015-10-19 DIAGNOSIS — R319 Hematuria, unspecified: Secondary | ICD-10-CM

## 2015-10-19 DIAGNOSIS — I5043 Acute on chronic combined systolic (congestive) and diastolic (congestive) heart failure: Secondary | ICD-10-CM | POA: Diagnosis present

## 2015-10-19 DIAGNOSIS — R0689 Other abnormalities of breathing: Secondary | ICD-10-CM

## 2015-10-19 DIAGNOSIS — T68XXXA Hypothermia, initial encounter: Secondary | ICD-10-CM | POA: Diagnosis present

## 2015-10-19 DIAGNOSIS — Z466 Encounter for fitting and adjustment of urinary device: Secondary | ICD-10-CM | POA: Diagnosis not present

## 2015-10-19 DIAGNOSIS — I130 Hypertensive heart and chronic kidney disease with heart failure and stage 1 through stage 4 chronic kidney disease, or unspecified chronic kidney disease: Secondary | ICD-10-CM | POA: Diagnosis present

## 2015-10-19 DIAGNOSIS — E039 Hypothyroidism, unspecified: Secondary | ICD-10-CM | POA: Diagnosis present

## 2015-10-19 DIAGNOSIS — Z87442 Personal history of urinary calculi: Secondary | ICD-10-CM | POA: Diagnosis not present

## 2015-10-19 DIAGNOSIS — R0602 Shortness of breath: Secondary | ICD-10-CM

## 2015-10-19 DIAGNOSIS — N179 Acute kidney failure, unspecified: Secondary | ICD-10-CM | POA: Diagnosis present

## 2015-10-19 DIAGNOSIS — R69 Illness, unspecified: Secondary | ICD-10-CM | POA: Diagnosis not present

## 2015-10-19 DIAGNOSIS — R7989 Other specified abnormal findings of blood chemistry: Secondary | ICD-10-CM | POA: Diagnosis not present

## 2015-10-19 DIAGNOSIS — K219 Gastro-esophageal reflux disease without esophagitis: Secondary | ICD-10-CM | POA: Diagnosis present

## 2015-10-19 DIAGNOSIS — Z7982 Long term (current) use of aspirin: Secondary | ICD-10-CM

## 2015-10-19 DIAGNOSIS — J9621 Acute and chronic respiratory failure with hypoxia: Secondary | ICD-10-CM | POA: Diagnosis present

## 2015-10-19 DIAGNOSIS — N189 Chronic kidney disease, unspecified: Secondary | ICD-10-CM | POA: Diagnosis not present

## 2015-10-19 DIAGNOSIS — E871 Hypo-osmolality and hyponatremia: Secondary | ICD-10-CM | POA: Diagnosis present

## 2015-10-19 DIAGNOSIS — R0600 Dyspnea, unspecified: Secondary | ICD-10-CM

## 2015-10-19 DIAGNOSIS — G4733 Obstructive sleep apnea (adult) (pediatric): Secondary | ICD-10-CM | POA: Diagnosis present

## 2015-10-19 DIAGNOSIS — N2581 Secondary hyperparathyroidism of renal origin: Secondary | ICD-10-CM | POA: Diagnosis present

## 2015-10-19 LAB — CBC WITH DIFFERENTIAL/PLATELET
BASOS PCT: 0 %
Basophils Absolute: 0 10*3/uL (ref 0–0.1)
EOS ABS: 0 10*3/uL (ref 0–0.7)
Eosinophils Relative: 0 %
HEMATOCRIT: 31.9 % — AB (ref 40.0–52.0)
HEMOGLOBIN: 10.4 g/dL — AB (ref 13.0–18.0)
LYMPHS ABS: 0.4 10*3/uL — AB (ref 1.0–3.6)
Lymphocytes Relative: 4 %
MCH: 25.7 pg — ABNORMAL LOW (ref 26.0–34.0)
MCHC: 32.7 g/dL (ref 32.0–36.0)
MCV: 78.5 fL — ABNORMAL LOW (ref 80.0–100.0)
Monocytes Absolute: 0.8 10*3/uL (ref 0.2–1.0)
Monocytes Relative: 10 %
NEUTROS ABS: 7 10*3/uL — AB (ref 1.4–6.5)
NEUTROS PCT: 86 %
Platelets: 158 10*3/uL (ref 150–440)
RBC: 4.06 MIL/uL — AB (ref 4.40–5.90)
RDW: 20.2 % — ABNORMAL HIGH (ref 11.5–14.5)
WBC: 8.2 10*3/uL (ref 3.8–10.6)

## 2015-10-19 LAB — TSH: TSH: 2.729 u[IU]/mL (ref 0.350–4.500)

## 2015-10-19 LAB — COMPREHENSIVE METABOLIC PANEL
ALBUMIN: 3 g/dL — AB (ref 3.5–5.0)
ALK PHOS: 165 U/L — AB (ref 38–126)
ALT: 19 U/L (ref 17–63)
AST: 39 U/L (ref 15–41)
Anion gap: 11 (ref 5–15)
BILIRUBIN TOTAL: 1.4 mg/dL — AB (ref 0.3–1.2)
BUN: 87 mg/dL — ABNORMAL HIGH (ref 6–20)
CALCIUM: 8.5 mg/dL — AB (ref 8.9–10.3)
CHLORIDE: 89 mmol/L — AB (ref 101–111)
CO2: 22 mmol/L (ref 22–32)
Creatinine, Ser: 4.98 mg/dL — ABNORMAL HIGH (ref 0.61–1.24)
GFR calc Af Amer: 11 mL/min — ABNORMAL LOW (ref >60)
GFR calc non Af Amer: 9 mL/min — ABNORMAL LOW (ref >60)
Glucose, Bld: 91 mg/dL (ref 65–99)
Potassium: 3.8 mmol/L (ref 3.5–5.1)
SODIUM: 122 mmol/L — AB (ref 135–145)
Total Protein: 6 g/dL — ABNORMAL LOW (ref 6.5–8.1)

## 2015-10-19 LAB — BRAIN NATRIURETIC PEPTIDE: B Natriuretic Peptide: 3206 pg/mL — ABNORMAL HIGH (ref 0.0–100.0)

## 2015-10-19 MED ORDER — ASPIRIN 81 MG PO CHEW
81.0000 mg | CHEWABLE_TABLET | Freq: Every day | ORAL | Status: DC
  Administered 2015-10-20 – 2015-10-21 (×2): 81 mg via ORAL
  Filled 2015-10-19 (×2): qty 1

## 2015-10-19 MED ORDER — SIMVASTATIN 20 MG PO TABS
20.0000 mg | ORAL_TABLET | Freq: Every day | ORAL | Status: DC
  Administered 2015-10-19 – 2015-10-21 (×3): 20 mg via ORAL
  Filled 2015-10-19 (×3): qty 1

## 2015-10-19 MED ORDER — CALCITRIOL 0.25 MCG PO CAPS
0.2500 ug | ORAL_CAPSULE | Freq: Every day | ORAL | Status: DC
  Administered 2015-10-20: 0.25 ug via ORAL
  Filled 2015-10-19 (×4): qty 1

## 2015-10-19 MED ORDER — FAMOTIDINE 20 MG PO TABS
20.0000 mg | ORAL_TABLET | Freq: Two times a day (BID) | ORAL | Status: DC
  Administered 2015-10-19 – 2015-10-20 (×2): 20 mg via ORAL
  Filled 2015-10-19 (×2): qty 1

## 2015-10-19 MED ORDER — ACETAMINOPHEN 325 MG PO TABS
650.0000 mg | ORAL_TABLET | Freq: Four times a day (QID) | ORAL | Status: DC | PRN
  Administered 2015-10-22: 650 mg via ORAL
  Filled 2015-10-19: qty 2

## 2015-10-19 MED ORDER — ACETAMINOPHEN 650 MG RE SUPP: 650.0000 mg | Freq: Four times a day (QID) | RECTAL | Status: DC | PRN

## 2015-10-19 MED ORDER — SODIUM CHLORIDE 0.9 % IV SOLN
INTRAVENOUS | Status: DC
  Administered 2015-10-19 – 2015-10-20 (×3): via INTRAVENOUS

## 2015-10-19 MED ORDER — TAMSULOSIN HCL 0.4 MG PO CAPS
0.4000 mg | ORAL_CAPSULE | Freq: Every day | ORAL | Status: DC
  Administered 2015-10-20 – 2015-10-21 (×2): 0.4 mg via ORAL
  Filled 2015-10-19 (×2): qty 1

## 2015-10-19 MED ORDER — HEPARIN SODIUM (PORCINE) 5000 UNIT/ML IJ SOLN
5000.0000 [IU] | Freq: Three times a day (TID) | INTRAMUSCULAR | Status: DC
  Administered 2015-10-19 – 2015-10-22 (×7): 5000 [IU] via SUBCUTANEOUS
  Filled 2015-10-19 (×7): qty 1

## 2015-10-19 MED ORDER — ONDANSETRON HCL 4 MG/2ML IJ SOLN: 4.0000 mg | Freq: Four times a day (QID) | INTRAMUSCULAR | Status: DC | PRN

## 2015-10-19 MED ORDER — ONDANSETRON HCL 4 MG PO TABS: 4.0000 mg | ORAL_TABLET | Freq: Four times a day (QID) | ORAL | Status: DC | PRN

## 2015-10-19 MED ORDER — LEVOTHYROXINE SODIUM 150 MCG PO TABS
150.0000 ug | ORAL_TABLET | Freq: Every day | ORAL | Status: DC
  Administered 2015-10-20 – 2015-10-21 (×2): 150 ug via ORAL
  Filled 2015-10-19 (×2): qty 1

## 2015-10-19 NOTE — ED Provider Notes (Signed)
Time Seen: Approximately 1538 I have reviewed the triage notes  Chief Complaint: Acute Renal Failure   History of Present Illness: Dennis Rice is a 82 y.o. male who is currently being assessed for the need for dialysis. Patient's had acute on chronic renal failure and pulmonary edema intermittently now for the past 2 months. Patient actually has an appointment with his nephrologist tomorrow, but today they felt "" they could not wait any longer at [facility] "". Outpatient labs show BUN of 79 and creatinine 4.29. Patient has a Foley catheter to "" measure his output "". The catheter has been draining blood and over the last 24 hours with no sizable urine and the question is whether or not the catheter is working at this point. The daughter states at the bedside is been flushed multiple times and they're not sure if it's working or not but they "" left the catheter and "". The patient states that he's had increasing lower abdominal pain. He states his abdomen feels distended. He continues to have pitting edema in all his extremities. He is on a chronic 2 L nasal cannula with which we have left in place and is satting at 100% Past Medical History  Diagnosis Date  . GERD (gastroesophageal reflux disease)   . PAD (peripheral artery disease) (HCC)   . Hypertension   . Coronary artery disease   . Back pain     Chronic  . BPH (benign prostatic hypertrophy)   . Kidney stones   . Hypothyroidism   . Hyperlipidemia   . OSA on CPAP     Patient Active Problem List   Diagnosis Date Noted  . Swelling of right hand 10/03/2015  . Oliguria 09/21/2015  . Acute on chronic renal failure (HCC) 09/21/2015  . BPH (benign prostatic hyperplasia) 09/21/2015  . HTN (hypertension) 09/21/2015  . OSA on CPAP 09/21/2015  . Chronic systolic heart failure (HCC) 10/26/2014  . GERD (gastroesophageal reflux disease) 10/26/2014    Past Surgical History  Procedure Laterality Date  . Cardiac catheterization     . Cataract extraction    . Lithotripsy      Past Surgical History  Procedure Laterality Date  . Cardiac catheterization    . Cataract extraction    . Lithotripsy      Current Outpatient Rx  Name  Route  Sig  Dispense  Refill  . aspirin 81 MG chewable tablet   Oral   Chew 81 mg by mouth daily.         . calcitRIOL (ROCALTROL) 0.25 MCG capsule   Oral   Take 0.25 mcg by mouth daily.         . cimetidine (TAGAMET) 200 MG tablet   Oral   Take 200 mg by mouth 2 (two) times daily.         . enalapril (VASOTEC) 2.5 MG tablet   Oral   Take 2.5 mg by mouth daily.         . furosemide (LASIX) 40 MG tablet   Oral   Take 1 tablet (40 mg total) by mouth daily.   30 tablet   0   . isosorbide-hydrALAZINE (BIDIL) 20-37.5 MG per tablet   Oral   Take 1 tablet by mouth 3 (three) times daily.         . metoprolol tartrate (LOPRESSOR) 25 MG tablet   Oral   Take 12.5 mg by mouth 2 (two) times daily.          Marland Kitchen  simvastatin (ZOCOR) 20 MG tablet   Oral   Take 20 mg by mouth at bedtime.         Marland Kitchen. SYNTHROID 150 MCG tablet   Oral   Take 150 mcg by mouth daily.           Dispense as written.     Allergies:  Review of patient's allergies indicates no known allergies.  Family History: History reviewed. No pertinent family history.  Social History: Social History  Substance Use Topics  . Smoking status: Never Smoker   . Smokeless tobacco: Never Used  . Alcohol Use: No     Review of Systems:   10 point review of systems was performed and was otherwise negative:  Constitutional: No fever Eyes: No visual disturbances ENT: No sore throat, ear pain Cardiac: No chest pain Respiratory: No increased shortness of breath, wheezing, or stridor Abdomen: No abdominal pain, no vomiting, No diarrhea Endocrine: No weight loss, No night sweats Extremities: Increasing bilateral peripheral edema right greater than the left, no cyanosis Skin: No rashes, easy  bruising Neurologic: No focal weakness, trouble with speech or swollowing Urologic: Hematuria with catheter   Physical Exam:  ED Triage Vitals  Enc Vitals Group     BP 2015-08-30 1533 92/62 mmHg     Pulse Rate 2015-08-30 1533 62     Resp 2015-08-30 1533 17     Temp 2015-08-30 1533 97.5 F (36.4 C)     Temp Source 2015-08-30 1533 Oral     SpO2 2015-08-30 1533 100 %     Weight 2015-08-30 1533 245 lb 4.8 oz (111.267 kg)     Height 2015-08-30 1533 5\' 5"  (1.651 m)     Head Cir --      [facility] --      Pain Score --      Pain Loc --      Pain Edu? --      Excl. in GC? --     General: Awake , Alert , and Oriented times 3; GCS 15 Head: Normal cephalic , atraumatic Eyes: Pupils equal , round, reactive to light Nose/Throat: No nasal drainage, patent upper airway without erythema or exudate.  Neck: Supple, Full range of motion, No anterior adenopathy or palpable thyroid masses Lungs: Diminished breath sounds at the bases without wheezes or rhonchi Heart: Regular rate, regular rhythm without murmurs , gallops , or rubs Abdomen: Tender over the lower middle quadrant without rebound, guarding , or rigidity; bowel sounds positive and symmetric in all 4 quadrants. No organomegaly .        Extremities: Bilateral pitting edema in both hands and lower extremities right hand worse than the left Neurologic: normal ambulation, Motor symmetric without deficits, sensory intact Skin: warm, dry, no rashes Blood in Foley catheter  Labs:   All laboratory work was reviewed including any pertinent negatives or positives listed below:  Labs Reviewed  COMPREHENSIVE METABOLIC PANEL  CBC WITH DIFFERENTIAL/PLATELET  BRAIN NATRIURETIC PEPTIDE  Reviewed the patient's laboratory work shows an increasing BUN and creatinine. Low sodium  EKG:  ED ECG REPORT I, Jennye MoccasinBrian S Quigley, the attending physician, personally viewed and interpreted this ECG.  Date: Jul 08, 2016 EKG Time: 1520 Rate: 55 Rhythm: Atrial fibrillation QRS  Axis: normal Intervals: normal ST/T Wave abnormalities: normal Conduction Disturbances: none Narrative Interpretation: unremarkable Poor R-wave progression in the anterior leads No acute ischemic changes are noted   Radiology:      Final result by Rad Results In Interface (2015-08-30 16:08:04)  Narrative:   CLINICAL DATA: Shortness of Breath  EXAM: PORTABLE CHEST 1 VIEW  COMPARISON: 09/21/2015  FINDINGS: Study is limited by poor inspiration. Cardiomegaly. There is probable small left pleural effusion. Trace bilateral basilar atelectasis or infiltrate. Stable scarring in left midlung. No pulmonary edema. Degenerative changes bilateral shoulders.  IMPRESSION: No pulmonary edema. Limited study by poor inspiration. Question small left pleural effusion. Bilateral basilar atelectasis or infiltrate. Stable scarring in left midlung.        \   I personally reviewed the radiologic studies   Procedures: Patient presents with a Foley catheter with hematuria and the bag. Does not appear to be draining and there was approximately 500 mL of retained fluid. I felt we needed to either get the Foley working or change it out for irrigation. We tried to flush the Foley catheter with no success and the old Foley catheter was removed and a three-way Foley was placed by the nursing staff with bladder irrigation.    ED Course: * Patient's stay here was uneventful and he is not currently hypoxic that has numerous electrolyte abnormalities and appears will require inpatient initiation of dialysis. I spoke to the patient's nephrologist and patient's case was turned over to the hospitalist team. We will follow the catheter was here in emergency department if he has discomfort or any issues we will remove the Foley cath. As I don't feel there is a lot of urine to be put out at this time. The blood that has been in the bag is been there for a couple of days according to his  daughter    Assessment: New-onset renal failure Hyponatremia Mild pulmonary edema Foley catheter complication    Plan: Inpatient management .             Jennye Moccasin, MD 10/13/2015 [phone]

## 2015-10-19 NOTE — H&P (Signed)
[facility] - [place] at [facility]   PATIENT NAME: Dennis Rice    MR#:  [id number]  DATE OF BIRTH:  [birth year]  DATE OF ADMISSION:  09/28/2015  PRIMARY CARE PHYSICIAN: Derwood Kaplan, MD   REQUESTING/REFERRING PHYSICIAN: Alphonzo Lemmings M.D.  CHIEF COMPLAINT:   Chief Complaint  Patient presents with  . Acute Renal Failure    HISTORY OF PRESENT ILLNESS: Dennis Rice  is a 82 y.o. male with a known history of recent hospitalization in February for acute on chronic renal failure as well as acute on chronic diastolic CHF. Currently resides at a skilled nursing facility. Patient had blood work drawn yesterday which shows worsening renal function. Therefore he was referred for admission. Patient has been having difficulty with anasarca. His got significant scrotal swelling and swelling of his lower extremity. Patient was noted to have a worsening renal function today again in the emergency room. He also had 500 cc of urine. His chest x-ray showed no evidence of CHF. ED staff is trying to put a Foley in place.  PAST MEDICAL HISTORY:   Past Medical History  Diagnosis Date  . GERD (gastroesophageal reflux disease)   . PAD (peripheral artery disease) (HCC)   . Hypertension   . Coronary artery disease   . Back pain     Chronic  . BPH (benign prostatic hypertrophy)   . Kidney stones   . Hypothyroidism   . Hyperlipidemia   . OSA on CPAP     PAST SURGICAL HISTORY: Past Surgical History  Procedure Laterality Date  . Cardiac catheterization    . Cataract extraction    . Lithotripsy      SOCIAL HISTORY:  Social History  Substance Use Topics  . Smoking status: Never Smoker   . Smokeless tobacco: Never Used  . Alcohol Use: No    FAMILY HISTORY: History reviewed. No pertinent family history.  DRUG ALLERGIES: No Known Allergies  REVIEW OF SYSTEMS:   CONSTITUTIONAL: No fever, positive fatigue and weakness.  EYES: No blurred or double vision.  EARS, NOSE,  AND THROAT: No tinnitus or ear pain.  RESPIRATORY: No cough, shortness of breath, wheezing or hemoptysis.  CARDIOVASCULAR: No chest pain, orthopnea, edema.  GASTROINTESTINAL: No nausea, vomiting, diarrhea or abdominal pain.  GENITOURINARY: No dysuria, hematuria. Scrotal edema ENDOCRINE: No polyuria, nocturia,  HEMATOLOGY: No anemia, easy bruising or bleeding SKIN: No rash or lesion. 2+ edema MUSCULOSKELETAL: No joint pain or arthritis.   NEUROLOGIC: No tingling, numbness, weakness.  PSYCHIATRY: No anxiety or depression.   MEDICATIONS AT HOME:  Prior to Admission medications   Medication Sig Start Date End Date Taking? Authorizing Provider  aspirin 81 MG chewable tablet Chew 81 mg by mouth daily.    Historical Provider, MD  calcitRIOL (ROCALTROL) 0.25 MCG capsule Take 0.25 mcg by mouth daily.    Historical Provider, MD  cimetidine (TAGAMET) 200 MG tablet Take 200 mg by mouth 2 (two) times daily.    Historical Provider, MD  enalapril (VASOTEC) 2.5 MG tablet Take 2.5 mg by mouth daily.    Historical Provider, MD  furosemide (LASIX) 40 MG tablet Take 1 tablet (40 mg total) by mouth daily. 09/26/15   Wyatt Haste, MD  isosorbide-hydrALAZINE (BIDIL) 20-37.5 MG per tablet Take 1 tablet by mouth 3 (three) times daily.    Historical Provider, MD  metoprolol tartrate (LOPRESSOR) 25 MG tablet Take 12.5 mg by mouth 2 (two) times daily.     Historical Provider, MD  simvastatin (  ZOCOR) 20 MG tablet Take 20 mg by mouth at bedtime.    Historical Provider, MD  SYNTHROID 150 MCG tablet Take 150 mcg by mouth daily.    Historical Provider, MD      PHYSICAL EXAMINATION:   VITAL SIGNS: Blood pressure 94/70, pulse 62, temperature 97.5 F (36.4 C), temperature source Oral, resp. rate 19, height 5\' 5"  (1.651 m), weight 111.267 kg (245 lb 4.8 oz), SpO2 100 %.  GENERAL:  82 y.o.-year-old patient lying in the bed chronically ill appearing EYES: Pupils equal, round, reactive to light and accommodation. No scleral  icterus. Extraocular muscles intact.  HEENT: Head atraumatic, normocephalic. Oropharynx and nasopharynx clear.  NECK:  Supple, no jugular venous distention. No thyroid enlargement, no tenderness.  LUNGS: Normal breath sounds bilaterally, no wheezing, rales,rhonchi or crepitation. No use of accessory muscles of respiration.  CARDIOVASCULAR: S1, S2 normal. No murmurs, rubs, or gallops.  ABDOMEN: Soft, nontender, nondistended. Bowel sounds present. No organomegaly or mass.  EXTREMITIES: 2+ edema cyanosis, or clubbing. Scrotal edema NEUROLOGIC: Cranial nerves II through XII are intact. Muscle strength 5/5 in all extremities. Sensation intact. Gait not checked.  PSYCHIATRIC: The patient is alert and oriented x 3.  SKIN: No obvious rash, lesion, or ulcer.   LABORATORY PANEL:   CBC  Recent Labs Lab 10/13/2015 1555  WBC 8.2  HGB 10.4*  HCT 31.9*  PLT 158  MCV 78.5*  MCH 25.7*  MCHC 32.7  RDW 20.2*  LYMPHSABS 0.4*  MONOABS 0.8  EOSABS 0.0  BASOSABS 0.0   ------------------------------------------------------------------------------------------------------------------  Chemistries   Recent Labs Lab 09/30/2015 1636  NA 122*  K 3.8  CL 89*  CO2 22  GLUCOSE 91  BUN 87*  CREATININE 4.98*  CALCIUM 8.5*  AST 39  ALT 19  ALKPHOS 165*  BILITOT 1.4*   ------------------------------------------------------------------------------------------------------------------ estimated creatinine clearance is 11.6 mL/min (by C-G formula based on Cr of 4.98). ------------------------------------------------------------------------------------------------------------------ No results for input(s): TSH, T4TOTAL, T3FREE, THYROIDAB in the last 72 hours.  Invalid input(s): FREET3   Coagulation profile No results for input(s): INR, PROTIME in the last 168 hours. ------------------------------------------------------------------------------------------------------------------- No results for  input(s): DDIMER in the last 72 hours. -------------------------------------------------------------------------------------------------------------------  Cardiac Enzymes No results for input(s): CKMB, TROPONINI, MYOGLOBIN in the last 168 hours.  Invalid input(s): CK ------------------------------------------------------------------------------------------------------------------ Invalid input(s): POCBNP  ---------------------------------------------------------------------------------------------------------------  Urinalysis    Component Value Date/Time   COLORURINE Yellow 02/01/2014 1020   APPEARANCEUR Clear 02/01/2014 1020   LABSPEC 1.008 02/01/2014 1020   PHURINE 5.0 02/01/2014 1020   GLUCOSEU Negative 02/01/2014 1020   HGBUR 1+ 02/01/2014 1020   BILIRUBINUR Negative 02/01/2014 1020   KETONESUR Negative 02/01/2014 1020   PROTEINUR 30 mg/dL 16/10/960407/04/2014 54091020   NITRITE Negative 02/01/2014 1020   LEUKOCYTESUR Negative 02/01/2014 1020     RADIOLOGY: Dg Chest Port 1 View  10/11/2015  CLINICAL DATA:  Shortness of Breath EXAM: PORTABLE CHEST 1 VIEW COMPARISON:  09/21/2015 FINDINGS: Study is limited by poor inspiration. Cardiomegaly. There is probable small left pleural effusion. Trace bilateral basilar atelectasis or infiltrate. Stable scarring in left midlung. No pulmonary edema. Degenerative changes bilateral shoulders. IMPRESSION: No pulmonary edema. Limited study by poor inspiration. Question small left pleural effusion. Bilateral basilar atelectasis or infiltrate. Stable scarring in left midlung. Electronically Signed   By: Natasha MeadLiviu  Pop M.D.   On: 10/11/2015 16:08    EKG: Orders placed or performed during the hospital encounter of 10/12/2015  . ED EKG  . ED EKG  . EKG 12-Lead  . EKG  12-Lead    IMPRESSION AND PLAN:  Patient is a 82 year old African-American male being admitted for acute renal failure on chronic renal failure  1. Acute renal failure on chronic renal failure  at this point even though patient appears fluid overloaded and and has anasarca his chest x-ray is negative. I will gently hydrate this patient. I will asked nephrology to see the patient. He's had issues with fluid balance consider hemodialysis if aggressive care is is requested by the family and patient  2. Hypotension we will hold all antihypertensives  3. Hypothyroidism continue Synthroid  4. Hyperlipidemia continue simvastatin  5. Miscellaneous heparin for DVT prophylaxis heparin   All the records are reviewed and case discussed with ED provider. Management plans discussed with the patient, family and they are in agreement.  CODE STATUS: Code Status History    Date Active Date Inactive Code Status Order ID Comments User Context   09/21/2015 11:02 PM 09/26/2015  5:59 PM Full Code [id number]  Oralia Manis, MD Inpatient    Advance Directive Documentation        Most Recent Value   Type of Advance Directive  Living will   Pre-existing out of facility DNR order (yellow form or pink MOST form)     "MOST" Form in Place?         TOTAL TIME TAKING CARE OF THIS PATIENT: .    Auburn Bilberry M.D on 10/21/2015 at 6:16 PM  Between 7am to 6pm - Pager - [phone]  After 6pm go to [email or web address] - password [email or web address] [facility]  [place] [facility]  Office  [phone]  CC: Primary care physician; Derwood Kaplan, MD

## 2015-10-19 NOTE — Progress Notes (Signed)
Report given by Viviann SpareSteven in ED; [facility].

## 2015-10-19 NOTE — ED Notes (Signed)
This nurse and Gearldine BienenstockBrandy, RN irrigated patient's foley twice per Dr. Huel CoteQuigley verbal order. Total input 140cc with 140cc output.  MD notified and new orders to change patient's foley with CBI given.

## 2015-10-19 NOTE — ED Notes (Addendum)
Pt to ED via EMS from [facility] c/o renal failure.  Per EMS pt had blood drawn yesterday with results BUN 79 and creatinin 4.29 with follow up appointment tomorrow. Pt currently denies pain, denies SOB.  States dysuria, presents with foley that was placed 3/22, minimal output and bloody.  Pt receives 160mg  Lasix with little output.  Pt has hx of HTN and CHF.   With EMS pt CBG 122, 97/63 BP, 50 HR.  Pt presents with pitting edema to extremities x4, distended abd.  Pt wears 2L oxygen continuously.

## 2015-10-20 LAB — CBC
HEMATOCRIT: 32.4 % — AB (ref 40.0–52.0)
HEMOGLOBIN: 10.5 g/dL — AB (ref 13.0–18.0)
MCH: 25.8 pg — ABNORMAL LOW (ref 26.0–34.0)
MCHC: 32.4 g/dL (ref 32.0–36.0)
MCV: 79.6 fL — AB (ref 80.0–100.0)
Platelets: 144 10*3/uL — ABNORMAL LOW (ref 150–440)
RBC: 4.07 MIL/uL — ABNORMAL LOW (ref 4.40–5.90)
RDW: 19.8 % — AB (ref 11.5–14.5)
WBC: 7.3 10*3/uL (ref 3.8–10.6)

## 2015-10-20 LAB — BASIC METABOLIC PANEL
ANION GAP: 14 (ref 5–15)
BUN: 88 mg/dL — ABNORMAL HIGH (ref 6–20)
CALCIUM: 8.6 mg/dL — AB (ref 8.9–10.3)
CHLORIDE: 93 mmol/L — AB (ref 101–111)
CO2: 18 mmol/L — AB (ref 22–32)
CREATININE: 4.97 mg/dL — AB (ref 0.61–1.24)
GFR calc non Af Amer: 9 mL/min — ABNORMAL LOW (ref >60)
GFR, EST AFRICAN AMERICAN: 11 mL/min — AB (ref >60)
GLUCOSE: 79 mg/dL (ref 65–99)
Potassium: 4.2 mmol/L (ref 3.5–5.1)
Sodium: 125 mmol/L — ABNORMAL LOW (ref 135–145)

## 2015-10-20 LAB — SODIUM: Sodium: 124 mmol/L — ABNORMAL LOW (ref 135–145)

## 2015-10-20 LAB — LACTIC ACID, PLASMA
LACTIC ACID, VENOUS: 1.8 mmol/L (ref 0.5–2.0)
Lactic Acid, Venous: 1.9 mmol/L (ref 0.5–2.0)

## 2015-10-20 LAB — MRSA PCR SCREENING: MRSA by PCR: NEGATIVE

## 2015-10-20 MED ORDER — FAMOTIDINE 20 MG PO TABS: 20.0000 mg | ORAL_TABLET | ORAL | Status: DC

## 2015-10-20 MED ORDER — PIPERACILLIN-TAZOBACTAM 3.375 G IVPB
3.3750 g | Freq: Two times a day (BID) | INTRAVENOUS | Status: DC
  Administered 2015-10-20 – 2015-10-23 (×7): 3.375 g via INTRAVENOUS
  Filled 2015-10-20 (×9): qty 50

## 2015-10-20 MED ORDER — FUROSEMIDE 10 MG/ML IJ SOLN
60.0000 mg | Freq: Once | INTRAMUSCULAR | Status: AC
  Administered 2015-10-20: 60 mg via INTRAVENOUS
  Filled 2015-10-20: qty 6

## 2015-10-20 NOTE — Progress Notes (Signed)
Initial Nutrition Assessment       INTERVENTION:  Meals and snacks: Cater to pt preferences Medical Nutrition Supplement: If unable to meet nutritional needs will add supplement   NUTRITION DIAGNOSIS:   Inadequate oral intake related to acute illness as evidenced by per patient/family report.    GOAL:   Patient will meet greater than or equal to 90% of their needs    MONITOR:   PO intake, Labs, I & O's  REASON FOR ASSESSMENT:   Diagnosis    ASSESSMENT:      Pt admitted with acute on chronic renal failure and hypothermia. Pt also with hyponatremia  Past Medical History  Diagnosis Date  . GERD (gastroesophageal reflux disease)   . PAD (peripheral artery disease) (HCC)   . Hypertension   . Coronary artery disease   . Back pain     Chronic  . BPH (benign prostatic hypertrophy)   . Kidney stones   . Hypothyroidism   . Hyperlipidemia   . OSA on CPAP     Current Nutrition: no lunch eaten today, full tray at bedside.  Noted ate 100% of breakfast this am  Food/Nutrition-Related History: pt reports appetite has been normal except for today, not hungry   Scheduled Medications:  . aspirin  81 mg Oral Daily  . calcitRIOL  0.25 mcg Oral Daily  . famotidine  20 mg Oral BID  . furosemide  60 mg Intravenous Once  . heparin  5,000 Units Subcutaneous 3 times per day  . levothyroxine  150 mcg Oral QAC breakfast  . piperacillin-tazobactam (ZOSYN)  IV  3.375 g Intravenous Q12H  . simvastatin  20 mg Oral QHS  . tamsulosin  0.4 mg Oral Daily         Electrolyte/Renal Profile and Glucose Profile:   Recent Labs Lab 10/22/2015 1636 10/20/15 0436  NA 122* 125*  K 3.8 4.2  CL 89* 93*  CO2 22 18*  BUN 87* 88*  CREATININE 4.98* 4.97*  CALCIUM 8.5* 8.6*  GLUCOSE 91 79   Protein Profile:   Recent Labs Lab 10/03/2015 1636  ALBUMIN 3.0*    Gastrointestinal Profile: Last BM: unknown   Nutrition-Focused Physical Exam Findings: Nutrition-Focused physical  exam completed. Findings are no fat depletion, no muscle depletion, and moderate edema.      Weight Change: wt gain noted    Diet Order:  Diet 2 gram sodium Room service appropriate?: Yes; Fluid consistency:: Thin  Skin:   reviewed   Height:   Ht Readings from Last 1 Encounters:  10/16/2015 5\' 9"  (1.753 m)    Weight:   Wt Readings from Last 1 Encounters:  09/30/2015 238 lb 11.2 oz (108.274 kg)    Ideal Body Weight:     BMI:  Body mass index is 35.23 kg/(m^2).  Estimated Nutritional Needs:   Kcal:  BEE 1385 kcals (IF 1.0-1.2, aF 1.3) 1800-2160 kcals/d.   Protein:  (1.0-1.2 g/kg) 73-88 g/d  Fluid:  (25-6130ml/kg) 1825-215990ml/d  EDUCATION NEEDS:   No education needs identified at this time  MODERATE Care Level  Darika Ildefonso B. Freida BusmanAllen, RD, LDN [phone] (pager) Weekend/On-Call pager [phone])

## 2015-10-20 NOTE — Clinical Social Work Note (Signed)
Clinical Social Work Assessment  Patient Details  Name: Dennis LegatoWalter L Rice MRN: [id number] Date of Birth: Sep 01, 1923  Date of referral:  10/20/15               Reason for consult:  Facility Placement                Permission sought to share information with:  Family Supports, Oceanographeracility Contact Representative Permission granted to share information::  Yes, Verbal Permission Granted  Name::        Agency::     Relationship::     Contact Information:     Housing/Transportation Living arrangements for the past 2 months:  Skilled Holiday representativeursing Facility, Single Family Home Source of Information:  Patient Patient Interpreter Needed:  None Criminal Activity/Legal Involvement Pertinent to Current Situation/Hospitalization:  No - Comment as needed Significant Relationships:  Adult Children Lives with:  Adult Children Do you feel safe going back to the place where you live?  Yes Need for family participation in patient care:  No (Coment)  Care giving concerns:  Patient resides at home with his older son.   Social Worker assessment / plan:  CSW consulted due to patient being from [facility]. CSW attempted to meet with patient but his orientation was questionable and he gave CSW permission to speak with one of his children. When asked which one, patient stated that I could call Dennis BlonderBarry Rice: [phone]. CSW contacted Mr. Azucena CecilBurton via phone and he acknowledged that patient has been at [facility] for STR and was into his copay days. Patient's son states that he normally resides with Dennis Rice's older brother but that it was too much for him and was needing help. Dennis Rice stated that the plans would be for patient to return there when time. FL2 completed and sent for signature.  Employment status:  Retired Health and safety inspectornsurance information:  Medicare PT Recommendations:  Not assessed at this time Information / Referral to community resources:     Patient/Family's Response to care:  Patient and son expressed  appreciation for CSW visit and call.  Patient/Family's Understanding of and Emotional Response to Diagnosis, Current Treatment, and Prognosis:  Patient appeared to be somewhat lethargic and difficult to understand at times but was able to let me know that I could call his son for further information and input.   Emotional Assessment Appearance:  Appears stated age Attitude/Demeanor/Rapport:   (pleasant and cooperative) Affect (typically observed):  Calm, Accepting Orientation:  Oriented to Self, Oriented to Place Alcohol / Substance use:  Not Applicable Psych involvement (Current and /or in the community):  No (Comment)  Discharge Needs  Concerns to be addressed:  Care Coordination Readmission within the last 30 days:  No Current discharge risk:  None Barriers to Discharge:  No Barriers Identified   York SpanielMonica Youssouf Shipley, LCSW 10/20/2015, 11:25 AM

## 2015-10-20 NOTE — Progress Notes (Signed)
[facility] -  at [facility]   PATIENT NAME: Dennis RutherfordWalter Conly    MR#:  [id number]  DATE OF BIRTH:  02/06/1924  SUBJECTIVE:   Patient has no bare hugger due to hypothermia. States he was admitted due to swelling.   REVIEW OF SYSTEMS:    Review of Systems  Constitutional: Negative for fever, chills and malaise/fatigue.  HENT: Negative for ear discharge, ear pain, hearing loss, nosebleeds and sore throat.   Eyes: Negative for blurred vision and pain.  Respiratory: Negative for cough, hemoptysis, shortness of breath and wheezing.   Cardiovascular: Positive for leg swelling. Negative for chest pain and palpitations.  Gastrointestinal: Negative for nausea, vomiting, abdominal pain, diarrhea and blood in stool.  Genitourinary: Positive for hematuria. Negative for dysuria.  Musculoskeletal: Negative for back pain.  Neurological: Negative for dizziness, tremors, speech change, focal weakness, seizures and headaches.  Endo/Heme/Allergies: Does not bruise/bleed easily.  Psychiatric/Behavioral: Positive for memory loss. Negative for depression, suicidal ideas and hallucinations.    Tolerating Diet: yes      DRUG ALLERGIES:  No Known Allergies  VITALS:  Blood pressure 98/54, pulse 47, temperature 93.5 F (34.2 C), temperature source Oral, resp. rate 17, height 5\' 9"  (1.753 m), weight 108.274 kg (238 lb 11.2 oz), SpO2 100 %.  PHYSICAL EXAMINATION:   Physical Exam  Constitutional: He is well-developed, well-nourished, and in no distress. No distress.  HENT:  Head: Normocephalic.  Eyes: No scleral icterus.  Neck: Normal range of motion. Neck supple. No JVD present. No tracheal deviation present.  Cardiovascular: Normal rate, regular rhythm and normal heart sounds.  Exam reveals no gallop and no friction rub.   No murmur heard. Pulmonary/Chest: Effort normal and breath sounds normal. No respiratory distress. He has no wheezes. He has no rales. He  exhibits no tenderness.  Abdominal: Soft. Bowel sounds are normal. He exhibits distension. He exhibits no mass. There is no tenderness. There is no rebound and no guarding.  Musculoskeletal: Normal range of motion. He exhibits edema.  Anasarca  Neurological: He is alert.  Oriented to name and place not time. States he is from home but actually he is from skilled nursing facility.  Skin: Skin is warm. No rash noted. No erythema.  Has bare hugger  Psychiatric: Affect normal.      LABORATORY PANEL:   CBC  Recent Labs Lab 10/20/15 0436  WBC 7.3  HGB 10.5*  HCT 32.4*  PLT 144*   ------------------------------------------------------------------------------------------------------------------  Chemistries   Recent Labs Lab 10/24/2015 1636 10/20/15 0436  NA 122* 125*  K 3.8 4.2  CL 89* 93*  CO2 22 18*  GLUCOSE 91 79  BUN 87* 88*  CREATININE 4.98* 4.97*  CALCIUM 8.5* 8.6*  AST 39  --   ALT 19  --   ALKPHOS 165*  --   BILITOT 1.4*  --    ------------------------------------------------------------------------------------------------------------------  Cardiac Enzymes No results for input(s): TROPONINI in the last 168 hours. ------------------------------------------------------------------------------------------------------------------  RADIOLOGY:  Dg Chest Port 1 View  10/02/2015  CLINICAL DATA:  Shortness of Breath EXAM: PORTABLE CHEST 1 VIEW COMPARISON:  09/21/2015 FINDINGS: Study is limited by poor inspiration. Cardiomegaly. There is probable small left pleural effusion. Trace bilateral basilar atelectasis or infiltrate. Stable scarring in left midlung. No pulmonary edema. Degenerative changes bilateral shoulders. IMPRESSION: No pulmonary edema. Limited study by poor inspiration. Question small left pleural effusion. Bilateral basilar atelectasis or infiltrate. Stable scarring in left midlung. Electronically Signed   By: Lanette HampshireLiviu  Pop M.D.  On: 10/24/2015 16:08      ASSESSMENT AND PLAN:    82 year old male with history of chronic kidney disease stage III and hypothyroidism who presents with anasarca and acute on chronic renal failure.  1. Acute on chronic renal failure: Nephrology consultation for further evaluation and management. Continue IV fluids for now.   2. Hyponatremia: Improved with IV fluids.  3. Hypothermia: Unclear etiology. Blood cultures 2. Lactic acid. Empiric Zosyn for sepsis.  4. Hypothyroidism: TSH within normal limits.  Continue Synthroid  5. Anasarca: Likely due to acute on chronic renal failure. No evidence of CHF on chest x-ray despite elevated BNP.  6. Hematuria with Foley catheter: Patient presented from nursing home with Foley catheter noted to have hematuria, now has CBI placed in the emergency room.  Currently Not draining bloody urine. Continue Flomax . Consider urology consult after nephrology consultation.    Management plans discussed with the patient and heis in agreement.  CODE STATUS: full  TOTAL TIME TAKING CARE OF THIS PATIENT: 30 minutes.     POSSIBLE D/C 3-5 days, DEPENDING ON CLINICAL CONDITION.   Jaely Silman M.D on 10/20/2015 at 10:42 AM  Between 7am to 6pm - Pager - [phone] After 6pm go to [email or web address] - password [email or web address] [facility]  [place] [facility]  Office  [phone]  CC: Primary care physician; Derwood Kaplan, MD  Note: This dictation was prepared with Dragon dictation along with smaller phrase technology. Any transcriptional errors that result from this process are unintentional.

## 2015-10-20 NOTE — Progress Notes (Signed)
[facility]  ROUNDING NOTE   Subjective:   Dennis Rice admitted overnight for Renal failure [N19] and hypothermia.  Kidney function is worse: Creatinine of 4.98 from creatinine of 2.21 on discharge 3/2. Also with hyponatremia of 122. Improved to 125 today  Objective:  Vital signs in last 24 hours:  Temp:  [92.8 F (33.8 C)-97.5 F (36.4 C)] 93.5 F (34.2 C) (03/27 0636) Pulse Rate:  [47-62] 56 (03/27 1258) Resp:  [10-19] 19 (03/27 1258) BP: (92-115)/(54-72) 97/62 mmHg (03/27 1258) SpO2:  [98 %-100 %] 100 % (03/27 1258) Weight:  [108.274 kg (238 lb 11.2 oz)-111.267 kg (245 lb 4.8 oz)] 108.274 kg (238 lb 11.2 oz) (03/26 1956)  Weight change:  Filed Weights   10/11/2015 1533 10/18/2015 1956  Weight: 111.267 kg (245 lb 4.8 oz) 108.274 kg (238 lb 11.2 oz)    Intake/Output: I/O last 3 completed shifts: In: 2603.5 [I.V.:803.5; Other:1800] Out: 1930 [Urine:1930]   Intake/Output this shift:  Total I/O In: 240 [P.O.:240] Out: -   Physical Exam: General: Cold, warming blanket  Head: Normocephalic, atraumatic. Moist oral mucosal membranes  Eyes: Anicteric, PERRL  Neck: Supple, trachea midline  Lungs:  Bilateral crackles  Heart: Regular rate and rhythm  Abdomen:  Soft, nontender, obese  Extremities:  ++ peripheral edema.  Neurologic: Nonfocal, moving all four extremities  Skin: No lesions       Basic Metabolic Panel:  Recent Labs Lab 10/20/2015 1636 10/20/15 0436  NA 122* 125*  K 3.8 4.2  CL 89* 93*  CO2 22 18*  GLUCOSE 91 79  BUN 87* 88*  CREATININE 4.98* 4.97*  CALCIUM 8.5* 8.6*    Liver Function Tests:  Recent Labs Lab 10/08/2015 1636  AST 39  ALT 19  ALKPHOS 165*  BILITOT 1.4*  PROT 6.0*  ALBUMIN 3.0*   No results for input(s): LIPASE, AMYLASE in the last 168 hours. No results for input(s): AMMONIA in the last 168 hours.  CBC:  Recent Labs Lab 10/03/2015 1555 10/20/15 0436  WBC 8.2 7.3  NEUTROABS 7.0*  --   HGB 10.4* 10.5*  HCT 31.9*  32.4*  MCV 78.5* 79.6*  PLT 158 144*    Cardiac Enzymes: No results for input(s): CKTOTAL, CKMB, CKMBINDEX, TROPONINI in the last 168 hours.  BNP: Invalid input(s): POCBNP  CBG: No results for input(s): GLUCAP in the last 168 hours.  Microbiology: Results for orders placed or performed during the hospital encounter of 09/25/2015  MRSA PCR Screening     Status: None   Collection Time: 10/11/2015 11:00 PM  Result Value Ref Range Status   MRSA by PCR NEGATIVE NEGATIVE Final    Comment:        The GeneXpert MRSA Assay (FDA approved for NASAL specimens only), is one component of a comprehensive MRSA colonization surveillance program. It is not intended to diagnose MRSA infection nor to guide or monitor treatment for MRSA infections.     Coagulation Studies: No results for input(s): LABPROT, INR in the last 72 hours.  Urinalysis: No results for input(s): COLORURINE, LABSPEC, PHURINE, GLUCOSEU, HGBUR, BILIRUBINUR, KETONESUR, PROTEINUR, UROBILINOGEN, NITRITE, LEUKOCYTESUR in the last 72 hours.  Invalid input(s): APPERANCEUR    Imaging: Dg Chest Port 1 View  09/29/2015  CLINICAL DATA:  Shortness of Breath EXAM: PORTABLE CHEST 1 VIEW COMPARISON:  09/21/2015 FINDINGS: Study is limited by poor inspiration. Cardiomegaly. There is probable small left pleural effusion. Trace bilateral basilar atelectasis or infiltrate. Stable scarring in left midlung. No pulmonary edema. Degenerative changes  bilateral shoulders. IMPRESSION: No pulmonary edema. Limited study by poor inspiration. Question small left pleural effusion. Bilateral basilar atelectasis or infiltrate. Stable scarring in left midlung. Electronically Signed   By: Lahoma Crocker M.D.   On: 09/27/2015 16:08     Medications:   . sodium chloride 75 mL/hr at 10/20/15 0411   . aspirin  81 mg Oral Daily  . calcitRIOL  0.25 mcg Oral Daily  . famotidine  20 mg Oral BID  . heparin  5,000 Units Subcutaneous 3 times per day  .  levothyroxine  150 mcg Oral QAC breakfast  . piperacillin-tazobactam (ZOSYN)  IV  3.375 g Intravenous Q12H  . simvastatin  20 mg Oral QHS  . tamsulosin  0.4 mg Oral Daily   acetaminophen **OR** acetaminophen, ondansetron **OR** ondansetron (ZOFRAN) IV  Assessment/ Plan:  [name] is a 82 y.o. black male with chronic kidney disease stage III, chronic systolic heart failure ejection fraction 30-35%, coronary artery disease, GERD, hypertension, chronic constipation, hyperlipidemia, and anxiety admitted with shortness of breath, increasing lower extremity edema in the setting of known systolic heart failure.  1. Acute renal failure on chronic kidney disease stage IV: baseline creatinine 2.21, eGFR of 29 from 3/2 - Concerning for acute renal failure from acute cardiorenal syndrome. Patient with fluid overload.  - Stop IV fluids, Trial of furosemide IV 41m x 1  2. Hyponatremia: hypervolemic.  - improved some with saline. Will check sodium q12.   3. Acute on chronic systolic heart failure: failed outpatient furosemide 469mdaily PO.  - IV furosemide as above.    LOS: 1 [place] 3/27/20171:28 PM

## 2015-10-20 NOTE — Care Management (Signed)
Patient from [facility].  CSW following.  RNCM signing off

## 2015-10-20 NOTE — Progress Notes (Signed)
ANTIBIOTIC CONSULT NOTE - INITIAL  Pharmacy Consult for Zosyn  Indication: sepsis  No Known Allergies  Patient Measurements: Height: 5\' 9"  (175.3 cm) Weight: 238 lb 11.2 oz (108.274 kg) IBW/kg (Calculated) : 70.7 Adjusted Body Weight:   Vital Signs: Temp: 93.5 F (34.2 C) (03/27 0636) Temp Source: Oral (03/27 0636) BP: 98/54 mmHg (03/27 0502) Pulse Rate: 47 (03/27 0502) Intake/Output from previous day: 03/26 0701 - 03/27 0700 In: 2603.5 [I.V.:803.5] Out: 1930 [Urine:1930] Intake/Output from this shift: Total I/O In: 240 [P.O.:240] Out: -   Labs:  Recent Labs  09/26/2015 1555 10/07/2015 1636 10/20/15 0436  WBC 8.2  --  7.3  HGB 10.4*  --  10.5*  PLT 158  --  144*  CREATININE  --  4.98* 4.97*   Estimated Creatinine Clearance: 12.2 mL/min (by C-G formula based on Cr of 4.97). No results for input(s): VANCOTROUGH, VANCOPEAK, VANCORANDOM, GENTTROUGH, GENTPEAK, GENTRANDOM, TOBRATROUGH, TOBRAPEAK, TOBRARND, AMIKACINPEAK, AMIKACINTROU, AMIKACIN in the last 72 hours.   Microbiology: Recent Results (from the past 720 hour(s))  MRSA PCR Screening     Status: None   Collection Time: 10/04/2015 11:00 PM  Result Value Ref Range Status   MRSA by PCR NEGATIVE NEGATIVE Final    Comment:        The GeneXpert MRSA Assay (FDA approved for NASAL specimens only), is one component of a comprehensive MRSA colonization surveillance program. It is not intended to diagnose MRSA infection nor to guide or monitor treatment for MRSA infections.     Medical History: Past Medical History  Diagnosis Date  . GERD (gastroesophageal reflux disease)   . PAD (peripheral artery disease) (HCC)   . Hypertension   . Coronary artery disease   . Back pain     Chronic  . BPH (benign prostatic hypertrophy)   . Kidney stones   . Hypothyroidism   . Hyperlipidemia   . OSA on CPAP     Medications:  Prescriptions prior to admission  Medication Sig Dispense Refill Last Dose  . aspirin 81 MG  chewable tablet Chew 81 mg by mouth daily.   unknown  . calcitRIOL (ROCALTROL) 0.25 MCG capsule Take 0.25 mcg by mouth daily.   unknown  . carvedilol (COREG) 6.25 MG tablet Take 6.25 mg by mouth 2 (two) times daily.   unknown  . cimetidine (TAGAMET) 200 MG tablet Take 200 mg by mouth 2 (two) times daily.   unknown  . furosemide (LASIX) 40 MG tablet Take 1 tablet (40 mg total) by mouth daily. (Patient taking differently: Take 80 mg by mouth 2 (two) times daily. ) 30 tablet 0 unknown  . isosorbide-hydrALAZINE (BIDIL) 20-37.5 MG per tablet Take 1 tablet by mouth 3 (three) times daily.   unknown  . metolazone (ZAROXOLYN) 2.5 MG tablet Take 2.5 mg by mouth 2 (two) times daily.   unknown  . simvastatin (ZOCOR) 20 MG tablet Take 20 mg by mouth at bedtime.   unknown  . sodium polystyrene (SPS) 15 GM/60ML suspension Take 30 g by mouth daily.   unknown  . SYNTHROID 150 MCG tablet Take 150 mcg by mouth every morning.    unknown   Scheduled:  . aspirin  81 mg Oral Daily  . calcitRIOL  0.25 mcg Oral Daily  . famotidine  20 mg Oral BID  . heparin  5,000 Units Subcutaneous 3 times per day  . levothyroxine  150 mcg Oral QAC breakfast  . piperacillin-tazobactam (ZOSYN)  IV  3.375 g Intravenous Q12H  . simvastatin  20 mg Oral QHS  . tamsulosin  0.4 mg Oral Daily   Assessment: Pharmacy consulted to dose and monitor Zosyn therapy for empiric treatment.  Goal of Therapy:  Resolution of condition  Plan:  Will start Zosyn 3.375 g IV q12 hours.    Dontaye Hur D 10/20/2015,11:03 AM

## 2015-10-20 NOTE — NC FL2 (Signed)
[place] MEDICAID [id number] LEVEL OF CARE SCREENING TOOL     IDENTIFICATION  Patient Name: Dennis Rice Birthdate: 06-15-1924 Sex: male Admission Date (Current Location): 10/22/2015  [place] and [place] Number:  Chiropodist and Address:  [facility], [address], [place], [place] [address]      Provider Number: [id number]  Attending Physician Name and Address:  Adrian Saran, MD  Relative Name and Phone Number:       Current Level of Care: Hospital Recommended Level of Care: Skilled Nursing Facility Prior Approval Number:    Date Approved/Denied:   PASRR Number:    Discharge Plan: SNF    Current Diagnoses: Patient Active Problem List   Diagnosis Date Noted  . Acute renal failure (ARF) (HCC) 10/16/2015  . Swelling of right hand 10/03/2015  . Oliguria 09/21/2015  . Acute on chronic renal failure (HCC) 09/21/2015  . BPH (benign prostatic hyperplasia) 09/21/2015  . HTN (hypertension) 09/21/2015  . OSA on CPAP 09/21/2015  . Chronic systolic heart failure (HCC) 10/26/2014  . GERD (gastroesophageal reflux disease) 10/26/2014    Orientation RESPIRATION BLADDER Height & Weight     Self, Place  Normal, O2 (2 liters) Continent Weight: 238 lb 11.2 oz (108.274 kg) Height:   (175.3 cm)  BEHAVIORAL SYMPTOMS/MOOD NEUROLOGICAL BOWEL NUTRITION STATUS   (none)  (none) Continent Diet (2 gm na)  AMBULATORY STATUS COMMUNICATION OF NEEDS Skin   Extensive Assist Verbally Normal                       Personal Care Assistance Level of Assistance  Bathing, Feeding, Dressing Bathing Assistance: Maximum assistance Feeding assistance: Maximum assistance Dressing Assistance: Maximum assistance     Functional Limitations Info  Sight Sight Info: Impaired        SPECIAL CARE FACTORS FREQUENCY  PT (By licensed PT)                    Contractures Contractures Info: Not present    Additional Factors Info  Allergies   Allergies  Info: nka           Current Medications (10/20/2015):  This is the current hospital active medication list Current Facility-Administered Medications  Medication Dose Route Frequency Provider Last Rate Last Dose  . 0.9 %  sodium chloride infusion   Intravenous Continuous Auburn Bilberry, MD 75 mL/hr at 10/20/15 0411    . acetaminophen (TYLENOL) tablet 650 mg  650 mg Oral Q6H PRN Auburn Bilberry, MD       Or  . acetaminophen (TYLENOL) suppository 650 mg  650 mg Rectal Q6H PRN Auburn Bilberry, MD      . aspirin chewable tablet 81 mg  81 mg Oral Daily Auburn Bilberry, MD   81 mg at 10/20/15 1111  . calcitRIOL (ROCALTROL) capsule 0.25 mcg  0.25 mcg Oral Daily Auburn Bilberry, MD   0.25 mcg at 10/20/15 1110  . famotidine (PEPCID) tablet 20 mg  20 mg Oral BID Auburn Bilberry, MD   20 mg at 10/20/15 1110  . heparin injection 5,000 Units  5,000 Units Subcutaneous 3 times per day Auburn Bilberry, MD   5,000 Units at 10/20/15 0553  . levothyroxine (SYNTHROID, LEVOTHROID) tablet 150 mcg  150 mcg Oral QAC breakfast Auburn Bilberry, MD   150 mcg at 10/20/15 1111  . ondansetron (ZOFRAN) tablet 4 mg  4 mg Oral Q6H PRN Auburn Bilberry, MD       Or  .  ondansetron (ZOFRAN) injection 4 mg  4 mg Intravenous Q6H PRN Auburn BilberryShreyang Patel, MD      . piperacillin-tazobactam (ZOSYN) IVPB 3.375 g  3.375 g Intravenous Q12H Sital Mody, MD      . simvastatin (ZOCOR) tablet 20 mg  20 mg Oral QHS Auburn BilberryShreyang Patel, MD   20 mg at 10/11/2015 2203  . tamsulosin (FLOMAX) capsule 0.4 mg  0.4 mg Oral Daily Auburn BilberryShreyang Patel, MD   0.4 mg at 10/20/15 1111     Discharge Medications: Please see discharge summary for a list of discharge medications.  Relevant Imaging Results:  Relevant Lab Results:   Additional Information    York SpanielMonica Kaileb [employer], LCSW

## 2015-10-20 NOTE — Progress Notes (Signed)
Notified MD of core body temp, warming blanket ordered and applied

## 2015-10-21 ENCOUNTER — Inpatient Hospital Stay: Payer: Medicare Other

## 2015-10-21 DIAGNOSIS — N189 Chronic kidney disease, unspecified: Secondary | ICD-10-CM

## 2015-10-21 DIAGNOSIS — Z466 Encounter for fitting and adjustment of urinary device: Secondary | ICD-10-CM

## 2015-10-21 DIAGNOSIS — R600 Localized edema: Secondary | ICD-10-CM

## 2015-10-21 LAB — CBC
HEMATOCRIT: 32.1 % — AB (ref 40.0–52.0)
HEMOGLOBIN: 10.5 g/dL — AB (ref 13.0–18.0)
MCH: 25.7 pg — ABNORMAL LOW (ref 26.0–34.0)
MCHC: 32.9 g/dL (ref 32.0–36.0)
MCV: 78.3 fL — ABNORMAL LOW (ref 80.0–100.0)
Platelets: 149 10*3/uL — ABNORMAL LOW (ref 150–440)
RBC: 4.1 MIL/uL — AB (ref 4.40–5.90)
RDW: 19.6 % — ABNORMAL HIGH (ref 11.5–14.5)
WBC: 6.1 10*3/uL (ref 3.8–10.6)

## 2015-10-21 LAB — BASIC METABOLIC PANEL
ANION GAP: 14 (ref 5–15)
BUN: 92 mg/dL — ABNORMAL HIGH (ref 6–20)
CALCIUM: 8.5 mg/dL — AB (ref 8.9–10.3)
CO2: 19 mmol/L — ABNORMAL LOW (ref 22–32)
Chloride: 93 mmol/L — ABNORMAL LOW (ref 101–111)
Creatinine, Ser: 5.52 mg/dL — ABNORMAL HIGH (ref 0.61–1.24)
GFR, EST AFRICAN AMERICAN: 9 mL/min — AB (ref >60)
GFR, EST NON AFRICAN AMERICAN: 8 mL/min — AB (ref >60)
GLUCOSE: 52 mg/dL — AB (ref 65–99)
POTASSIUM: 3.9 mmol/L (ref 3.5–5.1)
Sodium: 126 mmol/L — ABNORMAL LOW (ref 135–145)

## 2015-10-21 MED ORDER — FUROSEMIDE 10 MG/ML IJ SOLN
40.0000 mg | Freq: Two times a day (BID) | INTRAMUSCULAR | Status: DC
Start: 2015-10-21 — End: 2015-10-21
  Administered 2015-10-21: 40 mg via INTRAVENOUS
  Filled 2015-10-21: qty 4

## 2015-10-21 MED ORDER — FUROSEMIDE 10 MG/ML IJ SOLN
40.0000 mg | Freq: Two times a day (BID) | INTRAMUSCULAR | Status: DC
  Filled 2015-10-21: qty 4

## 2015-10-21 NOTE — Progress Notes (Signed)
[facility]  ROUNDING NOTE   Subjective:   Furosemide 37m x 1 with more than 7 lires of urine output.  Creatinine 4.97 (5.52)  Objective:  Vital signs in last 24 hours:  Temp:  [97.5 F (36.4 C)-97.7 F (36.5 C)] 97.5 F (36.4 C) (03/28 0415) Pulse Rate:  [56-99] 99 (03/28 0415) Resp:  [16-19] 16 (03/28 0415) BP: (94-102)/(54-62) 94/54 mmHg (03/28 0415) SpO2:  [100 %] 100 % (03/28 0415)  Weight change:  Filed Weights   10/14/2015 1533 10/14/2015 1956  Weight: 111.267 kg (245 lb 4.8 oz) 108.274 kg (238 lb 11.2 oz)    Intake/Output: I/O last 3 completed shifts: In: 6583.5 [P.O.:240; I.V.:803.5; Other:5500; IV Piggyback:40] Out: 9070 [[OLMBE:6754]  Intake/Output this shift:  Total I/O In: 120 [P.O.:120] Out: 1700 [Urine:1700]  Physical Exam: General: NAD  Head: Normocephalic, atraumatic. Moist oral mucosal membranes  Eyes: Anicteric, PERRL  Neck: Supple, trachea midline  Lungs:  Bilateral crackles  Heart: Regular rate and rhythm  Abdomen:  Soft, nontender, obese  Extremities:  + peripheral edema.  Neurologic: Nonfocal, moving all four extremities  Skin: No lesions       Basic Metabolic Panel:  Recent Labs Lab 10/08/2015 1636 10/20/15 0436 10/20/15 1727 10/21/15 0509  NA 122* 125* 124* 126*  K 3.8 4.2  --  3.9  CL 89* 93*  --  93*  CO2 22 18*  --  19*  GLUCOSE 91 79  --  52*  BUN 87* 88*  --  92*  CREATININE 4.98* 4.97*  --  5.52*  CALCIUM 8.5* 8.6*  --  8.5*    Liver Function Tests:  Recent Labs Lab 10/11/2015 1636  AST 39  ALT 19  ALKPHOS 165*  BILITOT 1.4*  PROT 6.0*  ALBUMIN 3.0*   No results for input(s): LIPASE, AMYLASE in the last 168 hours. No results for input(s): AMMONIA in the last 168 hours.  CBC:  Recent Labs Lab 10/17/2015 1555 10/20/15 0436 10/21/15 0509  WBC 8.2 7.3 6.1  NEUTROABS 7.0*  --   --   HGB 10.4* 10.5* 10.5*  HCT 31.9* 32.4* 32.1*  MCV 78.5* 79.6* 78.3*  PLT 158 144* 149*    Cardiac  Enzymes: No results for input(s): CKTOTAL, CKMB, CKMBINDEX, TROPONINI in the last 168 hours.  BNP: Invalid input(s): POCBNP  CBG: No results for input(s): GLUCAP in the last 168 hours.  Microbiology: Results for orders placed or performed during the hospital encounter of 10/14/2015  MRSA PCR Screening     Status: None   Collection Time: 10/10/2015 11:00 PM  Result Value Ref Range Status   MRSA by PCR NEGATIVE NEGATIVE Final    Comment:        The GeneXpert MRSA Assay (FDA approved for NASAL specimens only), is one component of a comprehensive MRSA colonization surveillance program. It is not intended to diagnose MRSA infection nor to guide or monitor treatment for MRSA infections.     Coagulation Studies: No results for input(s): LABPROT, INR in the last 72 hours.  Urinalysis: No results for input(s): COLORURINE, LABSPEC, PHURINE, GLUCOSEU, HGBUR, BILIRUBINUR, KETONESUR, PROTEINUR, UROBILINOGEN, NITRITE, LEUKOCYTESUR in the last 72 hours.  Invalid input(s): APPERANCEUR    Imaging: Dg Chest Port 1 View  10/06/2015  CLINICAL DATA:  Shortness of Breath EXAM: PORTABLE CHEST 1 VIEW COMPARISON:  09/21/2015 FINDINGS: Study is limited by poor inspiration. Cardiomegaly. There is probable small left pleural effusion. Trace bilateral basilar atelectasis or infiltrate. Stable scarring in left midlung.  No pulmonary edema. Degenerative changes bilateral shoulders. IMPRESSION: No pulmonary edema. Limited study by poor inspiration. Question small left pleural effusion. Bilateral basilar atelectasis or infiltrate. Stable scarring in left midlung. Electronically Signed   By: Lahoma Crocker M.D.   On: 10/10/2015 16:08     Medications:     . aspirin  81 mg Oral Daily  . calcitRIOL  0.25 mcg Oral Daily  . [START ON 10/22/2015] famotidine  20 mg Oral Q48H  . furosemide  40 mg Intravenous BID  . heparin  5,000 Units Subcutaneous 3 times per day  . levothyroxine  150 mcg Oral QAC breakfast  .  piperacillin-tazobactam (ZOSYN)  IV  3.375 g Intravenous Q12H  . simvastatin  20 mg Oral QHS  . tamsulosin  0.4 mg Oral Daily   acetaminophen **OR** acetaminophen, ondansetron **OR** ondansetron (ZOFRAN) IV  Assessment/ Plan:  [name] is a 82 y.o. black male with chronic kidney disease stage III, chronic systolic heart failure ejection fraction 30-35%, coronary artery disease, GERD, hypertension, chronic constipation, hyperlipidemia, and anxiety admitted with shortness of breath, increasing lower extremity edema in the setting of known systolic heart failure.  1. Acute renal failure on chronic kidney disease stage IV with metabolic acidosis: baseline creatinine 2.21, eGFR of 29 from 3/2 - Concerning for acute renal failure from acute cardiorenal syndrome. Patient with fluid overload.  - Continue IV furosemide. Will monitor kidney function closely. No acute indication for dialysis at this time.   2. Hyponatremia: hypervolemic.  - improved with furosemide, continue to monitor.   3. Acute on chronic systolic heart failure: failed outpatient furosemide 60m daily PO.  - IV furosemide as above.   4. Hypertension: with hypotension on admission. - furosemide as above. Continue tamsulosin  5. Secondary Hyperparathyroidism: PTH low at 34 - discontinue calcitriol.    LOS: 2[name] 3/28/201711:14 AM

## 2015-10-21 NOTE — Progress Notes (Signed)
[facility] [place] at [facility]   PATIENT NAME: Dennis RutherfordWalter Rice    MR#:  [id number]  DATE OF BIRTH:  [birth year]  SUBJECTIVE:   Temperature has improved with bear hugger. He is more confused this morning. He is eating breakfast with the assistance of CNA.  REVIEW OF SYSTEMS:    Review of Systems  Unable to perform ROS: other    Tolerating Diet: yes      DRUG ALLERGIES:  No Known Allergies  VITALS:  Blood pressure 94/54, pulse 99, temperature 97.5 F (36.4 C), temperature source Oral, resp. rate 16, height 5\' 9"  (1.753 m), weight 108.274 kg (238 lb 11.2 oz), SpO2 100 %.  PHYSICAL EXAMINATION:   Physical Exam  Constitutional: He is well-developed, well-nourished, and in no distress. No distress.  HENT:  Head: Normocephalic.  Eyes: No scleral icterus.  Neck: Normal range of motion. Neck supple. No JVD present. No tracheal deviation present.  Cardiovascular: Normal rate, regular rhythm and normal heart sounds.  Exam reveals no gallop and no friction rub.   No murmur heard. Pulmonary/Chest: Effort normal and breath sounds normal. No respiratory distress. He has no wheezes. He has no rales. He exhibits no tenderness.  Abdominal: Soft. Bowel sounds are normal. He exhibits no distension and no mass. There is no tenderness. There is no rebound and no guarding.  Musculoskeletal: Normal range of motion. He exhibits edema.  Anasarca  Neurological: He is alert.  Confused this morning  Skin: Skin is warm. No rash noted. No erythema.  Has bare hugger      LABORATORY PANEL:   CBC  Recent Labs Lab 10/21/15 0509  WBC 6.1  HGB 10.5*  HCT 32.1*  PLT 149*   ------------------------------------------------------------------------------------------------------------------  Chemistries   Recent Labs Lab 2015-08-02 1636  10/21/15 0509  NA 122*  < > 126*  K 3.8  < > 3.9  CL 89*  < > 93*  CO2 22  < > 19*  GLUCOSE 91  < > 52*  BUN 87*  < >  92*  CREATININE 4.98*  < > 5.52*  CALCIUM 8.5*  < > 8.5*  AST 39  --   --   ALT 19  --   --   ALKPHOS 165*  --   --   BILITOT 1.4*  --   --   < > = values in this interval not displayed. ------------------------------------------------------------------------------------------------------------------  Cardiac Enzymes No results for input(s): TROPONINI in the last 168 hours. ------------------------------------------------------------------------------------------------------------------  RADIOLOGY:  Dg Chest Port 1 View  2015/09/21  CLINICAL DATA:  Shortness of Breath EXAM: PORTABLE CHEST 1 VIEW COMPARISON:  09/21/2015 FINDINGS: Study is limited by poor inspiration. Cardiomegaly. There is probable small left pleural effusion. Trace bilateral basilar atelectasis or infiltrate. Stable scarring in left midlung. No pulmonary edema. Degenerative changes bilateral shoulders. IMPRESSION: No pulmonary edema. Limited study by poor inspiration. Question small left pleural effusion. Bilateral basilar atelectasis or infiltrate. Stable scarring in left midlung. Electronically Signed   By: Natasha MeadLiviu  Pop M.D.   On: 02017/02/26 16:08     ASSESSMENT AND PLAN:    82 year old male with history of chronic kidney disease stage III and hypothyroidism who presents with anasarca and acute on chronic renal failure.  1. Acute on chronic renal failure Stage IV: Worsening creatinine today due to IV Lasix. Patient has cardiorenal syndrome and will need ongoing monitoring of urine output and creatinine.   2. Hyponatremia: Improved  3. Hypothermia: Unclear etiology. Blood cultures 2  pending. Lactic acid normal. Empiric Zosyn for possible sepsis.  4. Hypothyroidism: TSH within normal limits.  Continue Synthroid  5. Anasarca: Likely due to acute on chronic renal failure and acute on chronic systolic heart failure EF 35% with severe concentric LVH on echo February 2017. Management as per nephrology.  60 mg IV 1  of Lasix yesterday. Now on Lasix 40 mg IV twice a day Monitor input and output.  6. Hematuria with Foley catheter: Patient presented from nursing home with Foley catheter noted to have hematuria, likely from trauma now has CBI placed in the emergency room.  Currently Not draining bloody urine. Stop CBI Continue Flomax .    CODE STATUS: full  TOTAL TIME TAKING CARE OF THIS PATIENT: 27 minutes.     POSSIBLE D/C 3-5 days, DEPENDING ON CLINICAL CONDITION.   Saina Waage M.D on 10/21/2015 at 11:01 AM  Between 7am to 6pm - Pager - [phone] After 6pm go to [email or web address] - password [email or web address] [facility]  [facility]  Office  [phone]  CC: Primary care physician; Derwood Kaplan, MD  Note: This dictation was prepared with Dragon dictation along with smaller phrase technology. Any transcriptional errors that result from this process are unintentional.

## 2015-10-21 NOTE — Clinical Social Work Note (Signed)
Patient's son came to visit patient yesterday afternoon and requested a new bedsearch as he has not been pleased with the care at [facility]. New bedsearch initiated. York SpanielMonica Nichole Keltner MSW,LCSW [phone]

## 2015-10-21 NOTE — Consult Note (Signed)
I have been asked to see the patient by Dr. Lamont DowdySarath Kolluru, MD, for evaluation and management of foley catheter.  History of present illness: This is an 82 year old otherwise very unhealthy man with cardiorenal syndrome who was admitted in acute renal failure and congestive heart failure.  My  history was mostly provided by the patient's nurse as I was unable to reach the consulting physician to discuss. The patient was admitted from a skilled nursing facility. A catheter was in place at the facility area drip on arrival to the emergency department the patient was noted to have gross hematuria.  A decision was made to place a 3-way Foley catheter and there was some difficulty placing the Foley catheter. However, the patient was ultimately placed on continuous bladder irrigation. This morning the irrigation was discontinued. There has been very little urine output since. The catheter is draining somewhat bloody urine.  Review of systems: A 12 point comprehensive review of systems was not obtained because the patient's altered mental status  Patient Active Problem List   Diagnosis Date Noted  . Acute renal failure (ARF) (HCC) 2015/09/20  . Swelling of right hand 10/03/2015  . Oliguria 09/21/2015  . Acute on chronic renal failure (HCC) 09/21/2015  . BPH (benign prostatic hyperplasia) 09/21/2015  . HTN (hypertension) 09/21/2015  . OSA on CPAP 09/21/2015  . Chronic systolic heart failure (HCC) 10/26/2014  . GERD (gastroesophageal reflux disease) 10/26/2014    No current facility-administered medications on file prior to encounter.   Current Outpatient Prescriptions on File Prior to Encounter  Medication Sig Dispense Refill  . aspirin 81 MG chewable tablet Chew 81 mg by mouth daily.    . calcitRIOL (ROCALTROL) 0.25 MCG capsule Take 0.25 mcg by mouth daily.    . cimetidine (TAGAMET) 200 MG tablet Take 200 mg by mouth 2 (two) times daily.    . furosemide (LASIX) 40 MG tablet Take 1 tablet (40 mg  total) by mouth daily. (Patient taking differently: Take 80 mg by mouth 2 (two) times daily. ) 30 tablet 0  . isosorbide-hydrALAZINE (BIDIL) 20-37.5 MG per tablet Take 1 tablet by mouth 3 (three) times daily.    . simvastatin (ZOCOR) 20 MG tablet Take 20 mg by mouth at bedtime.    Marland Kitchen. SYNTHROID 150 MCG tablet Take 150 mcg by mouth every morning.       Past Medical History  Diagnosis Date  . GERD (gastroesophageal reflux disease)   . PAD (peripheral artery disease) (HCC)   . Hypertension   . Coronary artery disease   . Back pain     Chronic  . BPH (benign prostatic hypertrophy)   . Kidney stones   . Hypothyroidism   . Hyperlipidemia   . OSA on CPAP     Past Surgical History  Procedure Laterality Date  . Cardiac catheterization    . Cataract extraction    . Lithotripsy      Social History  Substance Use Topics  . Smoking status: Never Smoker   . Smokeless tobacco: Never Used  . Alcohol Use: No    History reviewed. No pertinent family history.  PE: Filed Vitals:   10/20/15 1258 10/20/15 2135 10/21/15 0415 10/21/15 1252  BP: 97/62 102/55 94/54 109/64  Pulse: 56 62 99 69  Temp:  97.7 F (36.5 C) 97.5 F (36.4 C) 97.6 F (36.4 C)  TempSrc:  Oral Oral Oral  Resp: 19 16 16 19   Height:      Weight:  SpO2: 100% 100% 100% 100%   Patient Appears to be in no acute distress  patient is intermittently alert, but disoriented Atraumatic normocephalic head No cervical or supraclavicular lymphadenopathy appreciated Increased work of breathing, no audible wheezes/rhonchi The patient is anasarca Abdomen is soft, nontender, nondistended, no  suprapubic tenderness Lower extremities are symmetric   Grossly neurologically intact No identifiable skin lesions   Recent Labs  11-12-2015 1555 10/20/15 0436 10/21/15 0509  WBC 8.2 7.3 6.1  HGB 10.4* 10.5* 10.5*  HCT 31.9* 32.4* 32.1*    Recent Labs  2015-11-12 1636 10/20/15 0436 10/20/15 1727 10/21/15 0509  NA 122* 125*  124* 126*  K 3.8 4.2  --  3.9  CL 89* 93*  --  93*  CO2 22 18*  --  19*  GLUCOSE 91 79  --  52*  BUN 87* 88*  --  92*  CREATININE 4.98* 4.97*  --  5.52*  CALCIUM 8.5* 8.6*  --  8.5*   No results for input(s): LABPT, INR in the last 72 hours. No results for input(s): LABURIN in the last 72 hours. Results for orders placed or performed during the hospital encounter of 12-Nov-2015  MRSA PCR Screening     Status: None   Collection Time: 2015-11-12 11:00 PM  Result Value Ref Range Status   MRSA by PCR NEGATIVE NEGATIVE Final    Comment:        The GeneXpert MRSA Assay (FDA approved for NASAL specimens only), is one component of a comprehensive MRSA colonization surveillance program. It is not intended to diagnose MRSA infection nor to guide or monitor treatment for MRSA infections.   Culture, blood (Routine X 2) w Reflex to ID Panel     Status: None (Preliminary result)   Collection Time: 10/20/15 11:30 AM  Result Value Ref Range Status   Specimen Description BLOOD RIGHT HAND  Final   Special Requests   Final    BOTTLES DRAWN AEROBIC AND ANAEROBIC ANA . AER   Culture NO GROWTH < 24 HOURS  Final   Report Status PENDING  Incomplete  Culture, blood (Routine X 2) w Reflex to ID Panel     Status: None (Preliminary result)   Collection Time: 10/20/15 11:51 AM  Result Value Ref Range Status   Specimen Description BLOOD RIGHT HAND  Final   Special Requests BOTTLES DRAWN AEROBIC AND ANAEROBIC .  Final   Culture NO GROWTH < 24 HOURS  Final   Report Status PENDING  Incomplete    Imaging: Renal ultrasound was obtained for which I was present. We instilled 50 mL into the patient's Foley catheter and then captured bladder images demonstrating that the Foley catheter was in fact in the bladder and the bladder was empty. The patient had significant suprapubic edema.  Imp: The Foley catheter is in the correct position. There is ongoing hematuria although it does not appear to be so  much that he will form large clots in his bladder. Recommendations: I would recommend that the catheter remain to straight drainage so that better eyes and nose scan be obtained. I would start CBI if the urine was noted to thicken or there are blood clots present in the tubing.  Given the patient's overall health, I told think a formal hematuria evaluation is indicated at this time.  Please contact urology for additional assistance.  Berniece Salines W

## 2015-10-22 ENCOUNTER — Inpatient Hospital Stay: Payer: Medicare Other

## 2015-10-22 LAB — BASIC METABOLIC PANEL
ANION GAP: 17 — AB (ref 5–15)
BUN: 99 mg/dL — ABNORMAL HIGH (ref 6–20)
CHLORIDE: 91 mmol/L — AB (ref 101–111)
CO2: 18 mmol/L — AB (ref 22–32)
Calcium: 8.6 mg/dL — ABNORMAL LOW (ref 8.9–10.3)
Creatinine, Ser: 5.97 mg/dL — ABNORMAL HIGH (ref 0.61–1.24)
GFR calc non Af Amer: 7 mL/min — ABNORMAL LOW (ref >60)
GFR, EST AFRICAN AMERICAN: 9 mL/min — AB (ref >60)
Glucose, Bld: 56 mg/dL — ABNORMAL LOW (ref 65–99)
Potassium: 4.5 mmol/L (ref 3.5–5.1)
Sodium: 126 mmol/L — ABNORMAL LOW (ref 135–145)

## 2015-10-22 LAB — GLUCOSE, CAPILLARY
GLUCOSE-CAPILLARY: 109 mg/dL — AB (ref 65–99)
Glucose-Capillary: 41 mg/dL — CL (ref 65–99)

## 2015-10-22 LAB — CBC
HEMATOCRIT: 35 % — AB (ref 40.0–52.0)
HEMOGLOBIN: 11.4 g/dL — AB (ref 13.0–18.0)
MCH: 26.3 pg (ref 26.0–34.0)
MCHC: 32.7 g/dL (ref 32.0–36.0)
MCV: 80.3 fL (ref 80.0–100.0)
Platelets: 130 10*3/uL — ABNORMAL LOW (ref 150–440)
RBC: 4.35 MIL/uL — ABNORMAL LOW (ref 4.40–5.90)
RDW: 20.1 % — ABNORMAL HIGH (ref 11.5–14.5)
WBC: 7.1 10*3/uL (ref 3.8–10.6)

## 2015-10-22 MED ORDER — DEXTROSE 50 % IV SOLN
INTRAVENOUS | Status: AC
  Administered 2015-10-22: 50 mL via INTRAVENOUS
  Filled 2015-10-22: qty 50

## 2015-10-22 MED ORDER — FUROSEMIDE 10 MG/ML IJ SOLN
40.0000 mg | Freq: Once | INTRAMUSCULAR | Status: AC
  Administered 2015-10-22: 40 mg via INTRAVENOUS

## 2015-10-22 MED ORDER — DEXTROSE 50 % IV SOLN
50.0000 mL | Freq: Once | INTRAVENOUS | Status: AC
  Administered 2015-10-22: 50 mL via INTRAVENOUS

## 2015-10-22 NOTE — Progress Notes (Signed)
[facility] - [place] at [place]   PATIENT NAME: Dennis Rice    MR#:  [id number]  DATE OF BIRTH:  Sep 11, 1923  SUBJECTIVE:   Increased work of breathing - 5L oxygen this morning  REVIEW OF SYSTEMS:    Review of Systems  Unable to perform ROS: mental acuity  unable       DRUG ALLERGIES:  No Known Allergies  VITALS:  Blood pressure 108/62, pulse 66, temperature 97.7 F (36.5 C), temperature source Oral, resp. rate 16, height  (1.753 m), weight 108.274 kg (238 lb 11.2 oz), SpO2 100 %.  PHYSICAL EXAMINATION:   Physical Exam  VITAL SIGNS: Filed Vitals:   10/22/15 0541 10/22/15 1344  BP: 112/72 108/62  Pulse: 64 66  Temp: 97.1 F (36.2 C) 97.7 F (36.5 C)  Resp: 16 16   GENERAL:82 y.o.male currently in moderate acute distress given respiratory status.  HEAD: Normocephalic, atraumatic.  EYES: Pupils equal, round, reactive to light. Extraocular muscles intact. No scleral icterus.  MOUTH: Moist mucosal membrane. Dentition intact. No abscess noted.  EAR, NOSE, THROAT: Clear without exudates. No external lesions.  NECK: Supple. No thyromegaly. No nodules. No JVD.  PULMONARY: diffuse coarse breath sounds without wheeze. No use of accessory muscles, Good respiratory effort. good air entry bilaterally CHEST: Nontender to palpation.  CARDIOVASCULAR: S1 and S2. Regular rate and rhythm. No murmurs, rubs, or gallops. anasarca. Pedal pulses 2+ bilaterally.  GASTROINTESTINAL: Soft, nontender, nondistended. No masses. Positive bowel sounds. No hepatosplenomegaly.  MUSCULOSKELETAL: No swelling, clubbing, or edema. Range of motion full in all extremities.  NEUROLOGIC: unable to obtain given mental status and medical condition  SKIN: No ulceration, lesions, rashes, or cyanosis. Skin warm and dry. Turgor intact.  PSYCHIATRIC: unable to obtain given mental status and medical condition    LABORATORY PANEL:   CBC  Recent Labs Lab 10/22/15 0507   WBC 7.1  HGB 11.4*  HCT 35.0*  PLT 130*   ------------------------------------------------------------------------------------------------------------------  Chemistries   Recent Labs Lab 10/22/2015 1636  10/22/15 0507  NA 122*  < > 126*  K 3.8  < > 4.5  CL 89*  < > 91*  CO2 22  < > 18*  GLUCOSE 91  < > 56*  BUN 87*  < > 99*  CREATININE 4.98*  < > 5.97*  CALCIUM 8.5*  < > 8.6*  AST 39  --   --   ALT 19  --   --   ALKPHOS 165*  --   --   BILITOT 1.4*  --   --   < > = values in this interval not displayed. ------------------------------------------------------------------------------------------------------------------  Cardiac Enzymes No results for input(s): TROPONINI in the last 168 hours. ------------------------------------------------------------------------------------------------------------------  RADIOLOGY:  US Renal  10/21/2015  CLINICAL DATA:  Gross hematuria. EXAM: RENAL / URINARY TRACT ULTRASOUND COMPLETE COMPARISON:  Ultrasound of September 22, 2015. FINDINGS: Right Kidney: Length: 8.6 cm. Multiple cysts are noted with the largest measuring 3.4 cm in upper pole. Echogenicity within normal limits. No mass or hydronephrosis visualized. Left Kidney: Length: 10.7 cm. Multiple cysts are noted with the largest measuring 5.4 cm in upper pole. Echogenicity within normal limits. No mass or hydronephrosis visualized. Bladder: Foley catheter is noted. Ascites is noted as well. IMPRESSION: Bilateral simple renal cysts are noted. Mild right renal atrophy is noted. No hydronephrosis or renal obstruction is noted. Ascites is noted. Electronically Signed   By: Lupita Raider, M.D.   On: 10/21/2015 16:05  Dg Chest Port 1 View  10/22/2015  CLINICAL DATA:  Small bowel obstruction EXAM: PORTABLE CHEST 1 VIEW COMPARISON:  Three days a.m. FINDINGS: Linear scarring in the left perihilar lung. There is superimposed hazy opacity, greater on the right, compatible with pleural fluid. Chronic  cardiomegaly. No pulmonary edema seen in the upper lungs. No air leak. IMPRESSION: 1. Low volume chest with bilateral pleural effusion. Fluid could obscure atelectasis or pneumonia. 2. Left perihilar scarring. Electronically Signed   By: Marnee SpringJonathon  Watts M.D.   On: 10/22/2015 08:51     ASSESSMENT AND PLAN:    82 year old male with history of chronic kidney disease stage III and hypothyroidism who presents with anasarca and acute on chronic renal failure. 1. Acute on chronic respiratory failure: pulmonary edema, effusions - lasix 2. Acute on chronic renal failure Stage IV: Worsening creatinine today due to IV Lasix. Patient has cardiorenal syndrome and will need ongoing monitoring of urine output and creatinine. 3. Hyponatremia: Improved   4. Hypothyroidism: TSH within normal limits.  Continue Synthroid  5. Anasarca: Likely due to acute on chronic renal failure and acute on chronic systolic heart failure EF 35% with severe concentric LVH on echo February 2017. Management as per nephrology.  Lasix 40 mg IV twice a day Monitor input and output.  6. Hematuria with Foley catheter: Patient presented from nursing home with Foley catheter noted to have hematuria, likely from trauma now has CBI placed in the emergency room.  Currently Not draining bloody urine. Stop CBI Continue Flomax .    CODE STATUS: full  TOTAL TIME TAKING CARE OF THIS PATIENT:33 minutes.     POSSIBLE D/C 3-5 days, DEPENDING ON CLINICAL CONDITION.   Kunta Hilleary,  Mardi MainlandDavid K M.D on 10/22/2015 at 2:40 PM  Between 7am to 6pm - Pager - [phone] After 6pm go to [email or web address] - password [email or web address] [facility]  [facility]  Office  [phone]  CC: Primary care physician; Derwood KaplanEason,  Ernest B, MD  Note: This dictation was prepared with Dragon dictation along with smaller phrase technology. Any transcriptional errors that result from this process are unintentional.

## 2015-10-22 NOTE — Progress Notes (Signed)
[facility]  ROUNDING NOTE   Subjective:   UOP 1970 - furosemide 9m IV q12 Creatinine 5.97 (5.52)  Objective:  Vital signs in last 24 hours:  Temp:  [97.1 F (36.2 C)-97.6 F (36.4 C)] 97.1 F (36.2 C) (03/29 0541) Pulse Rate:  [64-69] 64 (03/29 0541) Resp:  [16-20] 16 (03/29 0541) BP: (101-112)/(57-72) 112/72 mmHg (03/29 0541) SpO2:  [100 %] 100 % (03/29 0541)  Weight change:  Filed Weights   09/26/2015 1533 10/20/2015 1956  Weight: 111.267 kg (245 lb 4.8 oz) 108.274 kg (238 lb 11.2 oz)    Intake/Output: I/O last 3 completed shifts: In: 33419[P.O.:360; Other:2860] Out: 73790[Urine:7470]   Intake/Output this shift:     Physical Exam: General: NAD  Head: Normocephalic, atraumatic. Moist oral mucosal membranes  Eyes: Anicteric, PERRL  Neck: Supple, trachea midline  Lungs:  Bilateral crackles  Heart: Regular rate and rhythm  Abdomen:  Soft, nontender, obese, abdominal wall edema  Extremities:  +++ peripheral edema.  Neurologic: Nonfocal, moving all four extremities  Skin: No lesions       Basic Metabolic Panel:  Recent Labs Lab 09/24/2015 1636 10/20/15 0436 10/20/15 1727 10/21/15 0509 10/22/15 0507  NA 122* 125* 124* 126* 126*  K 3.8 4.2  --  3.9 4.5  CL 89* 93*  --  93* 91*  CO2 22 18*  --  19* 18*  GLUCOSE 91 79  --  52* 56*  BUN 87* 88*  --  92* 99*  CREATININE 4.98* 4.97*  --  5.52* 5.97*  CALCIUM 8.5* 8.6*  --  8.5* 8.6*    Liver Function Tests:  Recent Labs Lab 10/09/2015 1636  AST 39  ALT 19  ALKPHOS 165*  BILITOT 1.4*  PROT 6.0*  ALBUMIN 3.0*   No results for input(s): LIPASE, AMYLASE in the last 168 hours. No results for input(s): AMMONIA in the last 168 hours.  CBC:  Recent Labs Lab 10/06/2015 1555 10/20/15 0436 10/21/15 0509 10/22/15 0507  WBC 8.2 7.3 6.1 7.1  NEUTROABS 7.0*  --   --   --   HGB 10.4* 10.5* 10.5* 11.4*  HCT 31.9* 32.4* 32.1* 35.0*  MCV 78.5* 79.6* 78.3* 80.3  PLT 158 144* 149* 130*     Cardiac Enzymes: No results for input(s): CKTOTAL, CKMB, CKMBINDEX, TROPONINI in the last 168 hours.  BNP: Invalid input(s): POCBNP  CBG: No results for input(s): GLUCAP in the last 168 hours.  Microbiology: Results for orders placed or performed during the hospital encounter of 10/05/2015  MRSA PCR Screening     Status: None   Collection Time: 10/09/2015 11:00 PM  Result Value Ref Range Status   MRSA by PCR NEGATIVE NEGATIVE Final    Comment:        The GeneXpert MRSA Assay (FDA approved for NASAL specimens only), is one component of a comprehensive MRSA colonization surveillance program. It is not intended to diagnose MRSA infection nor to guide or monitor treatment for MRSA infections.   Culture, blood (Routine X 2) w Reflex to ID Panel     Status: None (Preliminary result)   Collection Time: 10/20/15 11:30 AM  Result Value Ref Range Status   Specimen Description BLOOD RIGHT HAND  Final   Special Requests   Final    BOTTLES DRAWN AEROBIC AND ANAEROBIC ANA .5ML AER 2ML   Culture NO GROWTH < 24 HOURS  Final   Report Status PENDING  Incomplete  Culture, blood (Routine X 2) w Reflex to  ID Panel     Status: None (Preliminary result)   Collection Time: 10/20/15 11:51 AM  Result Value Ref Range Status   Specimen Description BLOOD RIGHT HAND  Final   Special Requests BOTTLES DRAWN AEROBIC AND ANAEROBIC .5ML  Final   Culture NO GROWTH < 24 HOURS  Final   Report Status PENDING  Incomplete    Coagulation Studies: No results for input(s): LABPROT, INR in the last 72 hours.  Urinalysis: No results for input(s): COLORURINE, LABSPEC, PHURINE, GLUCOSEU, HGBUR, BILIRUBINUR, KETONESUR, PROTEINUR, UROBILINOGEN, NITRITE, LEUKOCYTESUR in the last 72 hours.  Invalid input(s): APPERANCEUR    Imaging: US Renal  10/21/2015  CLINICAL DATA:  Gross hematuria. EXAM: RENAL / URINARY TRACT ULTRASOUND COMPLETE COMPARISON:  Ultrasound of September 22, 2015. FINDINGS: Right Kidney: Length:  8.6 cm. Multiple cysts are noted with the largest measuring 3.4 cm in upper pole. Echogenicity within normal limits. No mass or hydronephrosis visualized. Left Kidney: Length: 10.7 cm. Multiple cysts are noted with the largest measuring 5.4 cm in upper pole. Echogenicity within normal limits. No mass or hydronephrosis visualized. Bladder: Foley catheter is noted. Ascites is noted as well. IMPRESSION: Bilateral simple renal cysts are noted. Mild right renal atrophy is noted. No hydronephrosis or renal obstruction is noted. Ascites is noted. Electronically Signed   By: Marijo Conception, M.D.   On: 10/21/2015 16:05   Dg Chest Port 1 View  10/22/2015  CLINICAL DATA:  Small bowel obstruction EXAM: PORTABLE CHEST 1 VIEW COMPARISON:  Three days a.m. FINDINGS: Linear scarring in the left perihilar lung. There is superimposed hazy opacity, greater on the right, compatible with pleural fluid. Chronic cardiomegaly. No pulmonary edema seen in the upper lungs. No air leak. IMPRESSION: 1. Low volume chest with bilateral pleural effusion. Fluid could obscure atelectasis or pneumonia. 2. Left perihilar scarring. Electronically Signed   By: Monte Fantasia M.D.   On: 10/22/2015 08:51     Medications:     . aspirin  81 mg Oral Daily  . famotidine  20 mg Oral Q48H  . furosemide  40 mg Intravenous BID  . heparin  5,000 Units Subcutaneous 3 times per day  . levothyroxine  150 mcg Oral QAC breakfast  . piperacillin-tazobactam (ZOSYN)  IV  3.375 g Intravenous Q12H  . simvastatin  20 mg Oral QHS  . tamsulosin  0.4 mg Oral Daily   acetaminophen **OR** acetaminophen, ondansetron **OR** ondansetron (ZOFRAN) IV  Assessment/ Plan:  Dennis Rice is a 82 y.o. black male with chronic kidney disease stage III, chronic systolic heart failure ejection fraction 30-35%, coronary artery disease, GERD, hypertension, chronic constipation, hyperlipidemia, and anxiety admitted with shortness of breath, increasing lower extremity  edema in the setting of known systolic heart failure.  1. Acute renal failure on chronic kidney disease stage IV with metabolic acidosis: baseline creatinine 2.21, eGFR of 29 from 3/2 - Concerning for acute renal failure from acute cardiorenal syndrome. Patient with fluid overload.  - Furosemide given this morning, will hold next dose.  - Continue to monitor volume status. Low threshold to start dialysis. Discussed case with patient's daughter in law, Ivin Booty. Family meeting for later today or tomorrow.   2. Hyponatremia: hypervolemic.  - improved with furosemide, continue to monitor.   3. Acute on chronic systolic heart failure: failed outpatient furosemide 43m daily PO.  - IV furosemide as above.   4. Hypertension: with hypotension on admission. - furosemide as above. Continue tamsulosin  5. Secondary Hyperparathyroidism: PTH low at  34 - discontinue calcitriol.    LOS: 3 Mkenzie Dotts 3/29/20179:50 AM

## 2015-10-22 NOTE — Progress Notes (Signed)
Rapid response called pt has NRB on pt sats not correlating with machine . Pt placed on portable monitor.pt noted to be sinus brady 58-64. .saturations 99-100% on NRB  Pt BP wnl  Map .>60. Pt Blood sugar checked 41 pt given dextrose 50ml per protocol. Nurse advised to contact physician for further orders.

## 2015-10-22 NOTE — Progress Notes (Signed)
Increased wob - on 5LNC as opposed to recent documented 2L  Diffuse coarse rhonchi - cxr, lasix Full note to follow

## 2015-10-23 ENCOUNTER — Inpatient Hospital Stay: Payer: Medicare Other

## 2015-10-23 DIAGNOSIS — I5041 Acute combined systolic (congestive) and diastolic (congestive) heart failure: Secondary | ICD-10-CM

## 2015-10-23 DIAGNOSIS — N5089 Other specified disorders of the male genital organs: Secondary | ICD-10-CM

## 2015-10-23 DIAGNOSIS — I130 Hypertensive heart and chronic kidney disease with heart failure and stage 1 through stage 4 chronic kidney disease, or unspecified chronic kidney disease: Principal | ICD-10-CM

## 2015-10-23 DIAGNOSIS — R7989 Other specified abnormal findings of blood chemistry: Secondary | ICD-10-CM

## 2015-10-23 DIAGNOSIS — I509 Heart failure, unspecified: Secondary | ICD-10-CM

## 2015-10-23 DIAGNOSIS — I2510 Atherosclerotic heart disease of native coronary artery without angina pectoris: Secondary | ICD-10-CM

## 2015-10-23 DIAGNOSIS — R600 Localized edema: Secondary | ICD-10-CM

## 2015-10-23 DIAGNOSIS — J9621 Acute and chronic respiratory failure with hypoxia: Secondary | ICD-10-CM

## 2015-10-23 DIAGNOSIS — N049 Nephrotic syndrome with unspecified morphologic changes: Secondary | ICD-10-CM

## 2015-10-23 DIAGNOSIS — N179 Acute kidney failure, unspecified: Secondary | ICD-10-CM

## 2015-10-23 DIAGNOSIS — R69 Illness, unspecified: Secondary | ICD-10-CM

## 2015-10-23 DIAGNOSIS — R601 Generalized edema: Secondary | ICD-10-CM

## 2015-10-23 DIAGNOSIS — Z515 Encounter for palliative care: Secondary | ICD-10-CM

## 2015-10-23 DIAGNOSIS — I10 Essential (primary) hypertension: Secondary | ICD-10-CM

## 2015-10-23 LAB — TROPONIN I: Troponin I: 0.34 ng/mL — ABNORMAL HIGH (ref <0.031)

## 2015-10-23 LAB — GLUCOSE, CAPILLARY
Glucose-Capillary: 66 mg/dL (ref 65–99)
Glucose-Capillary: 179 mg/dL — ABNORMAL HIGH (ref 65–99)
Glucose-Capillary: 97 mg/dL (ref 65–99)

## 2015-10-23 LAB — BLOOD GAS, ARTERIAL
ACID-BASE DEFICIT: 7.3 mmol/L — AB (ref 0.0–2.0)
ALLENS TEST (PASS/FAIL): POSITIVE — AB
BICARBONATE: 20.1 meq/L — AB (ref 21.0–28.0)
FIO2: 1
O2 Saturation: 96.1 %
PCO2 ART: 47 mmHg (ref 32.0–48.0)
PO2 ART: 96 mmHg (ref 83.0–108.0)
Patient temperature: 37
pH, Arterial: 7.24 — ABNORMAL LOW (ref 7.350–7.450)

## 2015-10-23 LAB — MAGNESIUM: Magnesium: 2.3 mg/dL (ref 1.7–2.4)

## 2015-10-23 LAB — MRSA PCR SCREENING: MRSA by PCR: NEGATIVE

## 2015-10-23 MED ORDER — MORPHINE BOLUS VIA INFUSION
5.0000 mg | INTRAVENOUS | Status: DC | PRN
  Filled 2015-10-23: qty 20

## 2015-10-23 MED ORDER — HEPARIN (PORCINE) IN NACL 100-0.45 UNIT/ML-% IJ SOLN
1000.0000 [IU]/h | INTRAMUSCULAR | Status: DC
  Filled 2015-10-23: qty 250

## 2015-10-23 MED ORDER — MORPHINE 100MG IN NS 100ML (1MG/ML) PREMIX INFUSION
10.0000 mg/h | INTRAVENOUS | Status: DC
  Administered 2015-10-23: 10 mg/h via INTRAVENOUS
  Filled 2015-10-23: qty 100

## 2015-10-23 MED ORDER — LORAZEPAM 2 MG/ML IJ SOLN
0.5000 mg | INTRAMUSCULAR | Status: DC | PRN
  Administered 2015-10-23: 1 mg via INTRAVENOUS
  Filled 2015-10-23: qty 1

## 2015-10-23 MED ORDER — DOPAMINE-DEXTROSE 3.2-5 MG/ML-% IV SOLN
0.0000 ug/kg/min | INTRAVENOUS | Status: DC
  Administered 2015-10-23: 5 ug/kg/min via INTRAVENOUS
  Filled 2015-10-23: qty 250

## 2015-10-23 MED ORDER — HEPARIN BOLUS VIA INFUSION
4000.0000 [IU] | Freq: Once | INTRAVENOUS | Status: DC
  Filled 2015-10-23: qty 4000

## 2015-10-23 MED ORDER — MORPHINE SULFATE 25 MG/ML IV SOLN
10.0000 mg/h | INTRAVENOUS | Status: DC
  Filled 2015-10-23: qty 10

## 2015-10-23 MED ORDER — PROCHLORPERAZINE 25 MG RE SUPP
25.0000 mg | Freq: Three times a day (TID) | RECTAL | Status: DC | PRN
  Filled 2015-10-23: qty 1

## 2015-10-23 MED ORDER — DEXTROSE 50 % IV SOLN
50.0000 mL | Freq: Once | INTRAVENOUS | Status: AC
  Administered 2015-10-23: 50 mL via INTRAVENOUS
  Filled 2015-10-23: qty 50

## 2015-10-23 MED ORDER — MORPHINE BOLUS VIA INFUSION
5.0000 mg | INTRAVENOUS | Status: DC | PRN
  Filled 2015-10-23: qty 10

## 2015-10-23 MED ORDER — BISACODYL 10 MG RE SUPP
10.0000 mg | Freq: Every day | RECTAL | Status: DC | PRN
Start: 2015-10-23 — End: 2015-10-24

## 2015-10-25 LAB — CULTURE, BLOOD (ROUTINE X 2)
CULTURE: NO GROWTH
Culture: NO GROWTH

## 2015-10-25 NOTE — Progress Notes (Signed)
   09/30/2015 1916  Clinical Encounter Type  Visited With Family  Visit Type Death  Referral From Nurse  Consult/Referral To Chaplain  Chaplain provided pastoral care to family.   Fisher ScientificChaplain Shavonta Gossen [phone]

## 2015-10-25 NOTE — Consult Note (Signed)
PULMONARY / CRITICAL CARE MEDICINE   Name: Dennis Rice MRN: [id number] DOB: [birth year]    ADMISSION DATE:  10/01/2015 CONSULTATION DATE:  11-12-15  REFERRING MD :  Dr. Clint Guy   CHIEF COMPLAINT:  Reason for consult - respiratory distress        HISTORY OF PRESENT ILLNESS  Per chart review and son at bedside. 82 year old with past medical history of peripheral artery disease, hypertension, coronary artery disease, systolic and diastolic heart failure, right-sided heart failure, left-sided heart failure, left atrial dilation, left ventricular dilation, ejection fraction 35-40%, admitted to the hospital on 10/09/2015 with acute renal failure and swelling all over his body's including his hands and scrotum. His generalized anasarca was being managed with IV Lasix by internal medicine, however throughout the course of his hospitalization his respiratory status started declining, he was requiring up to 5 L of nasal cannula today, he started to become apneic, chest x-ray with probable interstitial edema, small right pleural effusion, by basilar opacities, he was requiring a nonrebreather. He was transferred to the ICU for further care monitoring. Patient is critically ill appearing, requiring possible Vas-Cath placement and a formal dialysis, along with probable intubation for impending respiratory failure. After prolonged discussion with his son, and given the number of comorbidities and multiorgan system failure of the patient, the son has made the patient DNR/DNI and comfort care.  RN Witness - Alcide Goodness      SIGNIFICANT EVENTS      PAST MEDICAL HISTORY    :  Past Medical History  Diagnosis Date  . GERD (gastroesophageal reflux disease)   . PAD (peripheral artery disease) (HCC)   . Hypertension   . Coronary artery disease   . Back pain     Chronic  . BPH (benign prostatic hypertrophy)   . Kidney stones   . Hypothyroidism   . Hyperlipidemia   . OSA on CPAP    Past  Surgical History  Procedure Laterality Date  . Cardiac catheterization    . Cataract extraction    . Lithotripsy     Prior to Admission medications   Medication Sig Start Date End Date Taking? Authorizing Provider  aspirin 81 MG chewable tablet Chew 81 mg by mouth daily.   Yes Historical Provider, MD  calcitRIOL (ROCALTROL) 0.25 MCG capsule Take 0.25 mcg by mouth daily.   Yes Historical Provider, MD  carvedilol (COREG) 6.25 MG tablet Take 6.25 mg by mouth 2 (two) times daily.   Yes Historical Provider, MD  cimetidine (TAGAMET) 200 MG tablet Take 200 mg by mouth 2 (two) times daily.   Yes Historical Provider, MD  furosemide (LASIX) 40 MG tablet Take 1 tablet (40 mg total) by mouth daily. Patient taking differently: Take 80 mg by mouth 2 (two) times daily.  09/26/15  Yes Wyatt Haste, MD  isosorbide-hydrALAZINE (BIDIL) 20-37.5 MG per tablet Take 1 tablet by mouth 3 (three) times daily.   Yes Historical Provider, MD  metolazone (ZAROXOLYN) 2.5 MG tablet Take 2.5 mg by mouth 2 (two) times daily.   Yes Historical Provider, MD  simvastatin (ZOCOR) 20 MG tablet Take 20 mg by mouth at bedtime.   Yes Historical Provider, MD  sodium polystyrene (SPS) 15 GM/60ML suspension Take 30 g by mouth daily.   Yes Historical Provider, MD  SYNTHROID 150 MCG tablet Take 150 mcg by mouth every morning.    Yes Historical Provider, MD   No Known Allergies   FAMILY HISTORY   History reviewed. No pertinent family  history.    SOCIAL HISTORY    reports that he has never smoked. He has never used smokeless tobacco. He reports that he does not drink alcohol or use illicit drugs.  Review of Systems  Unable to perform ROS: mental acuity      VITAL SIGNS    Temp:  [96.3 F (35.7 C)-97.7 F (36.5 C)] 96.3 F (35.7 C) (03/30 1100) Pulse Rate:  [25-165] 66 (03/30 1000) Resp:  [16] 16 (03/30 1100) BP: (80-141)/(45-98) 141/98 mmHg (03/30 1100) SpO2:  [79 %-100 %] 100 % (03/30 1000) HEMODYNAMICS:    VENTILATOR SETTINGS:   INTAKE / OUTPUT:  Intake/Output Summary (Last 24 hours) at 2016/01/05 1206 Last data filed at 2016/01/05 1104  Gross per 24 hour  Intake 397.38 ml  Output    325 ml  Net  72.38 ml       PHYSICAL EXAM   Physical Exam  Constitutional:  Critically ill appearing with generalized edema  HENT:  Right Ear: External ear normal.  Left Ear: External ear normal.  Dry mucus membranes   Eyes: Pupils are equal, round, and reactive to light.  Cardiovascular:  Irregular HR  Pulmonary/Chest:  Moderate respiratory distress - labored breathing with periods of apnea Shallow BS Coarse upper airway sounds  Abdominal: Soft. Bowel sounds are normal.  Genitourinary:  Severe scrotal edema   Musculoskeletal: He exhibits edema.  2+ pitting edema B/L Le Upper extremity b/l edema  Neurological:  Mildly responsive to sternal rub.   Skin: Skin is warm. There is erythema.  Nursing note and vitals reviewed.      LABS   LABS:  CBC  Recent Labs Lab 10/20/15 0436 10/21/15 0509 10/22/15 0507  WBC 7.3 6.1 7.1  HGB 10.5* 10.5* 11.4*  HCT 32.4* 32.1* 35.0*  PLT 144* 149* 130*   Coag's No results for input(s): APTT, INR in the last 168 hours. BMET  Recent Labs Lab 10/20/15 0436 10/20/15 1727 10/21/15 0509 10/22/15 0507  NA 125* 124* 126* 126*  K 4.2  --  3.9 4.5  CL 93*  --  93* 91*  CO2 18*  --  19* 18*  BUN 88*  --  92* 99*  CREATININE 4.97*  --  5.52* 5.97*  GLUCOSE 79  --  52* 56*   Electrolytes  Recent Labs Lab 10/20/15 0436 10/21/15 0509 10/22/15 0507  CALCIUM 8.6* 8.5* 8.6*  MG  --   --  2.3   Sepsis Markers  Recent Labs Lab 10/20/15 1130 10/20/15 1354  LATICACIDVEN 1.9 1.8   ABG  Recent Labs Lab 2016/01/05 1129  PHART 7.24*  PCO2ART 47  PO2ART 96   Liver Enzymes  Recent Labs Lab 10/12/2015 1636  AST 39  ALT 19  ALKPHOS 165*  BILITOT 1.4*  ALBUMIN 3.0*   Cardiac Enzymes  Recent Labs Lab 2016/01/05 0038  TROPONINI  0.34*   Glucose  Recent Labs Lab 10/22/15 2133 10/22/15 2224 2016/01/05 0228 2016/01/05 0400 2016/01/05 1055  GLUCAP 41* 109* 66 179* 97     Recent Results (from the past 240 hour(s))  MRSA PCR Screening     Status: None   Collection Time: 10/12/2015 11:00 PM  Result Value Ref Range Status   MRSA by PCR NEGATIVE NEGATIVE Final    Comment:        The GeneXpert MRSA Assay (FDA approved for NASAL specimens only), is one component of a comprehensive MRSA colonization surveillance program. It is not intended to diagnose MRSA infection nor to guide  or monitor treatment for MRSA infections.   Culture, blood (Routine X 2) w Reflex to ID Panel     Status: None (Preliminary result)   Collection Time: 10/20/15 11:30 AM  Result Value Ref Range Status   Specimen Description BLOOD RIGHT HAND  Final   Special Requests   Final    BOTTLES DRAWN AEROBIC AND ANAEROBIC ANA . AER   Culture NO GROWTH 2 DAYS  Final   Report Status PENDING  Incomplete  Culture, blood (Routine X 2) w Reflex to ID Panel     Status: None (Preliminary result)   Collection Time: 10/20/15 11:51 AM  Result Value Ref Range Status   Specimen Description BLOOD RIGHT HAND  Final   Special Requests BOTTLES DRAWN AEROBIC AND ANAEROBIC .  Final   Culture NO GROWTH 2 DAYS  Final   Report Status PENDING  Incomplete     Current facility-administered medications:  .  acetaminophen (TYLENOL) tablet 650 mg, 650 mg, Oral, Q6H PRN, 650 mg at 10/22/15 2105 **OR** acetaminophen (TYLENOL) suppository 650 mg, 650 mg, Rectal, Q6H PRN, Auburn Bilberry, MD .  aspirin chewable tablet 81 mg, 81 mg, Oral, Daily, Auburn Bilberry, MD, 81 mg at 10/21/15 1127 .  DOPamine (INTROPIN) 800 mg in dextrose 5 % 250 mL (3.2 mg/mL) infusion, 0-20 mcg/kg/min, Intravenous, Titrated, Pavan Pyreddy, MD, Last Rate: 10.2 mL/hr at 10/10/2015 0230, 5 mcg/kg/min at 09/24/2015 0230 .  famotidine (PEPCID) tablet 20 mg, 20 mg, Oral, Q48H, Auburn Bilberry, MD, 20 mg  at 10/22/15 1117 .  heparin ADULT infusion 100 units/mL (25000 units/250 mL), 1,000 Units/hr, Intravenous, Continuous, Wyatt Haste, MD, 1,000 Units/hr at 10/14/2015 0500 .  heparin bolus via infusion 4,000 Units, 4,000 Units, Intravenous, Once, Wyatt Haste, MD, 4,000 Units at 10/05/2015 0500 .  levothyroxine (SYNTHROID, LEVOTHROID) tablet 150 mcg, 150 mcg, Oral, QAC breakfast, Auburn Bilberry, MD, 150 mcg at 10/21/15 1127 .  LORazepam (ATIVAN) injection 0.5-1 mg, 0.5-1 mg, Intravenous, Q2H PRN, Deaunte Dente, MD .  morphine 250 mg in dextrose 5 % 250 mL (1 mg/mL) infusion, 10 mg/hr, Intravenous, Continuous, Katheline Brendlinger, MD .  morphine bolus via infusion 5-20 mg, 5-20 mg, Intravenous, Q15 min PRN, Khamora Karan, MD .  ondansetron (ZOFRAN) tablet 4 mg, 4 mg, Oral, Q6H PRN **OR** ondansetron (ZOFRAN) injection 4 mg, 4 mg, Intravenous, Q6H PRN, Auburn Bilberry, MD .  piperacillin-tazobactam (ZOSYN) IVPB 3.375 g, 3.375 g, Intravenous, Q12H, Sital Mody, MD, 3.375 g at 10/05/2015 1124 .  simvastatin (ZOCOR) tablet 20 mg, 20 mg, Oral, QHS, Auburn Bilberry, MD, 20 mg at 10/21/15 2120 .  tamsulosin (FLOMAX) capsule 0.4 mg, 0.4 mg, Oral, Daily, Auburn Bilberry, MD, 0.4 mg at 10/21/15 1127  IMAGING    Dg Chest Port 1 View  10/07/2015  CLINICAL DATA:  Acute onset of difficulty breathing. Initial encounter. EXAM: PORTABLE CHEST 1 VIEW COMPARISON:  Chest radiograph performed 10/22/2015 FINDINGS: The lungs are hypoexpanded. A small right pleural effusion is suggested. Mild bibasilar opacities may reflect atelectasis or possibly pneumonia. No pneumothorax is seen. The cardiomediastinal silhouette is borderline normal in size. No acute osseous abnormalities are identified. Mild degenerative change is noted at both glenohumeral joints. IMPRESSION: Lungs hypoexpanded. Small right pleural effusion suggested. Mild bibasilar opacities may reflect atelectasis or possibly mild pneumonia. Findings are similar in appearance to the  recent prior study. Electronically Signed   By: Roanna Raider M.D.   On: 10/11/2015 01:19      Indwelling Urinary Catheter continued, requirement  due to   Reason to continue Indwelling Urinary Catheter for strict Intake/Output monitoring for hemodynamic instability   Central Line continued, requirement due to   Reason to continue [employer] Monitoring of central venous pressure or other hemodynamic parameters   Ventilator continued, requirement due to, resp failure    Ventilator Sedation RASS 0 to -2    ASSESSMENT/PLAN  82 year old critically ill appearing male, with acute on chronic renal failure, respiratory failure, elevated troponin, worsening renal function, worsening respiratory status, transferred to ICU for further intervention, now comfort care.   Assessment :  Systolic and diastolic heart failure with ejection fraction of 35-40% Dilated left atrium Dilated left ventricle Severely dilated right ventricle Pulmonary hypertension Cardiorenal syndrome Acute on chronic renal failure Acute on chronic respiratory failure Pulmonary edema Pulmonary effusion Anasarca Hematuria Hypothyroidism   Plan: -DNR/DNI -Comfort care -After discussion with patient's son along with Dr.Kolluru (Nephrology),  son has made patient DO NOT RESUSCITATE DO NOT INTUBATE and comfort care. Patient with complex medical problems including multiorgan system failure, prominently cardiac and renal. Intubation and dialysis would be a temporizing measure, however, given his multiple comorbidities and overall declining health status over the last 2 months, these interventions would not lead to any significant quality of life prior to his overall health decline. Son also agreed with no further aggressive interventions/treatments, and stated that his father has lived a long life and would want no suffering or pain in his last days.    I have personally obtained a history, examined the patient,  evaluated laboratory and imaging results, formulated the assessment and plan and placed orders.  The Patient requires high complexity decision making for assessment and support, frequent evaluation and titration of therapies, application of advanced monitoring technologies and extensive interpretation of multiple databases. Critical Care Time devoted to patient care services described in this note is 35 minutes.   Overall, patient is critically ill, prognosis is guarded. Patient at high risk for cardiac arrest and death.   Stephanie Acre, MD [facility] Pager [phone] (please enter 7-digits) On Call Pager - [phone] (please enter 7-digits)     09/30/2015, 12:06 PM  Note: This note was prepared with Dragon dictation along with smaller phrase technology. Any transcriptional errors that result from this process are unintentional.

## 2015-10-25 NOTE — Progress Notes (Signed)
Update from this morning after family discussions patient now comfort measures only transferred to oncology floor

## 2015-10-25 NOTE — Significant Event (Signed)
Rapid Response Event Note  Overview:      Initial Focused Assessment: Patient lying in bed, obtunded, on Dobutamine drip. Dr. Imogene Burnhen at bedside. Interventions: Transfer to CCU 7 for possible BiPap, hemodialysis, or intubation. Event Summary:   at      at          [facility] Barno

## 2015-10-25 NOTE — Progress Notes (Signed)
EKG completed

## 2015-10-25 NOTE — Progress Notes (Signed)
Pt transferred to CCU7

## 2015-10-25 NOTE — Discharge Summary (Signed)
   [facility] [place] [facility]   PATIENT NAME: Dennis RutherfordWalter Rice    MR#:  [id number]  DATE OF BIRTH:  May 13, 1924  DATE OF ADMISSION:  10/24/2015 ADMITTING PHYSICIAN: Auburn BilberryShreyang Patel, MD  DATE OF DISCHARGE: 10/07/2015  PRIMARY CARE PHYSICIAN: Derwood KaplanEason,  Ernest B, MD    ADMISSION DIAGNOSIS:  Renal failure [N19]  DISCHARGE DIAGNOSIS:  Acute on chronic respiratory failure with hypoxia Acute on chronic renal failure Acute on chronic congestive heart failure, systolic  SECONDARY DIAGNOSIS:   Past Medical History  Diagnosis Date  . GERD (gastroesophageal reflux disease)   . PAD (peripheral artery disease) (HCC)   . Hypertension   . Coronary artery disease   . Back pain     Chronic  . BPH (benign prostatic hypertrophy)   . Kidney stones   . Hypothyroidism   . Hyperlipidemia   . OSA on CPAP     HOSPITAL COURSE:  Dennis Rice  is a 82 y.o. male admitted 10/20/2015 with chief complaint Acute Renal Failure . Please see H&P performed by Auburn BilberryShreyang Patel, MD for further information. On admission patient sent in with known kidney disease and worsening levels of diuresis originally placed on IV Lasix with only minimal improvement. However, his condition continued to deteriorate many discussions were had about starting dialysis the risk and benefit this may entail. His oxygen status declined throughout his hospitalization he was placed into the intensive care unit after he will require nonrebreather and higher level diuresis and care. We continued his course however after much discussion with the family he was made DO NOT RESUSCITATE and transitioned over to comfort measures. After being placed on comfort measures he died within the same day. Cause of death  acute on chronic respiratory failure, hypoxic,  acute on chronic renal failure,  acute on chronic congestive heart failure systolic  DISCHARGE CONDITIONS:   Deceased

## 2015-10-25 NOTE — Consult Note (Signed)
Palliative Medicine Inpatient Consult Note   Name: Dennis LegatoWalter L Rice Date: 10/01/2015 MRN: [id number]  DOB: 1924-04-02  Referring Physician: Wyatt Hasteavid K Hower, MD  Palliative Care consult requested for this 82 y.o. male for goals of medical therapy in patient with acute renal failiure.   TODAY'S DISCUSSIONS AND DECISIONS: Pt had a rapid response last pm and was moved to the unit. He has deteriorated clinically since then quite drastically. Dr Dema SeverinMungal was able to have a conversation with family about pt's grim prognosis.  Family agreed to comfort terminal care. Pt is here now on a morphine drip at 10 mg/hr with respirations at about 6 per minute.  Will not decrease the dose at this time. I made a few adjustments to the orders --but my primary role at this point was in providing support to the family.  He is not stable [facility] or elsewhere.    BRIEF HISTORY: Pt is an 82 yo man who resides in a skilled nursing facility who came in on 3/26 when routine blood work at the facility revealed worsening renal function. He had a foley placed at the facility to monitor output and for 24 hrs prior to coming to the ED, he had had significant hematuria and he developed lower abdominal pain.  He had a recent hospital stay here in February for acute on chronic renal failure. He has anasarca and has had increasing edema of his scrotum and legs.  He was to be evaluated as an oupt for possible need for dialysis.  He is on oxygen at night at the facility.  He had no signs of CHF on CXR.  A three-way foley was placed for continuous bladder irrigation. Baseline Creatinine is 2.21.  He was hypothermic and a bear hugger was placed and his temperature improved.  Hematuria resolved.  He is known to have a cardiomyopathy with an EF of 35%.  His urine output dropped off once the three way catheter was removed and a Urology consult was obtained.  Another Foley was placed and a work up for hematuria was recommended at  some time. Dr Ronn MelenaKolloru spoke with pt's family and a meeting with family is to take place soon. Pt had an increase in his dyspnea and O2 was increased to 5 LPM.      IMPRESSION: Acute on chronic renal failure with anasarca ---was being evaluated as an oupt for need for dialysis but required admission due to worsening creatinine Acute on chronic respiratory failure due to cardiorenal syndrome ---with worsening hypoxia and O2 needs  Hematuria --resolved with CBI  --thought due to catheter trauma BPH  H/O CHF (systolic with EF of 35%) ---failed outpt daily oral lasix H/O kidney stones Hypothyroidism Secondary Hyperparathyroidism Hyponatremia Hyperlipidemia OSA on CPAP at night HTN CAD PAD GERD Chronic Back Pain Confusion Weakness   REVIEW OF SYSTEMS:  Patient is not able to provide ROS due to illness   SOCIAL HISTORY:  reports that he has never smoked. He has never used smokeless tobacco. He reports that he does not drink alcohol or use illicit drugs. Has son, daughter-in-law, and brothers.   LEGAL DOCUMENTS:  none  CODE STATUS: Full code--now changed to DNR  PAST MEDICAL HISTORY: Past Medical History  Diagnosis Date  . GERD (gastroesophageal reflux disease)   . PAD (peripheral artery disease) (HCC)   . Hypertension   . Coronary artery disease   . Back pain     Chronic  . BPH (benign prostatic hypertrophy)   .  Kidney stones   . Hypothyroidism   . Hyperlipidemia   . OSA on CPAP     PAST SURGICAL HISTORY:  Past Surgical History  Procedure Laterality Date  . Cardiac catheterization    . Cataract extraction    . Lithotripsy      ALLERGIES:  has No Known Allergies.  MEDICATIONS:  Current Facility-Administered Medications  Medication Dose Route Frequency Provider Last Rate Last Dose  . acetaminophen (TYLENOL) tablet 650 mg  650 mg Oral Q6H PRN Auburn Bilberry, MD   650 mg at 10/22/15 2105   Or  . acetaminophen (TYLENOL) suppository 650 mg  650 mg Rectal Q6H  PRN Auburn Bilberry, MD      . aspirin chewable tablet 81 mg  81 mg Oral Daily Auburn Bilberry, MD   81 mg at 10/21/15 1127  . DOPamine (INTROPIN) 800 mg in dextrose 5 % 250 mL (3.2 mg/mL) infusion  0-20 mcg/kg/min Intravenous Titrated Pavan Pyreddy, MD      . famotidine (PEPCID) tablet 20 mg  20 mg Oral Q48H Auburn Bilberry, MD   20 mg at 10/22/15 1117  . heparin injection 5,000 Units  5,000 Units Subcutaneous 3 times per day Auburn Bilberry, MD   5,000 Units at 10/22/15 1546  . levothyroxine (SYNTHROID, LEVOTHROID) tablet 150 mcg  150 mcg Oral QAC breakfast Auburn Bilberry, MD   150 mcg at 10/21/15 1127  . ondansetron (ZOFRAN) tablet 4 mg  4 mg Oral Q6H PRN Auburn Bilberry, MD       Or  . ondansetron (ZOFRAN) injection 4 mg  4 mg Intravenous Q6H PRN Auburn Bilberry, MD      . piperacillin-tazobactam (ZOSYN) IVPB 3.375 g  3.375 g Intravenous Q12H Sital Mody, MD   3.375 g at 10/22/15 1120  . simvastatin (ZOCOR) tablet 20 mg  20 mg Oral QHS Auburn Bilberry, MD   20 mg at 10/21/15 2120  . tamsulosin (FLOMAX) capsule 0.4 mg  0.4 mg Oral Daily Auburn Bilberry, MD   0.4 mg at 10/21/15 1127    Vital Signs: BP 80/47 mmHg  Pulse 62  Temp(Src) 97.3 F (36.3 C) (Oral)  Resp 16  Ht  (1.753 m)  Wt 108.274 kg (238 lb 11.2 oz)  BMI 35.23 kg/m2  SpO2 100% Filed Weights   2015-11-01 1533 11-01-2015 1956  Weight: 111.267 kg (245 lb 4.8 oz) 108.274 kg (238 lb 11.2 oz)    Estimated body mass index is 35.23 kg/(m^2) as calculated from the following:   Height as of this encounter:  (1.753 m).   Weight as of this encounter: 108.274 kg (238 lb 11.2 oz).  PERFORMANCE STATUS (ECOG) : 4 - Bedbound  PHYSICAL EXAM: Lying in medical bed Unresponsive Appears to be peaceful Resp =6/min hrt rrr  Lungs decreased BS halfway down bilat   LABS: CBC:    Component Value Date/Time   WBC 7.1 10/22/2015 0507   WBC 6.9 02/02/2014 0640   HGB 11.4* 10/22/2015 0507   HGB 13.2 02/02/2014 0640   HCT 35.0*  10/22/2015 0507   HCT 41.5 02/02/2014 0640   PLT 130* 10/22/2015 0507   PLT 171 02/02/2014 0640   MCV 80.3 10/22/2015 0507   MCV 84 02/02/2014 0640   NEUTROABS 7.0* 11/01/15 1555   NEUTROABS 5.0 02/02/2014 0640   LYMPHSABS 0.4* 11-01-15 1555   LYMPHSABS 1.0 02/02/2014 0640   MONOABS 0.8 2015/11/01 1555   MONOABS 0.8 02/02/2014 0640   EOSABS 0.0 2015/11/01 1555   EOSABS 0.1 02/02/2014  0640   BASOSABS 0.0 2015/11/07 1555   BASOSABS 0.1 02/02/2014 0640   Comprehensive Metabolic Panel:    Component Value Date/Time   NA 126* 10/22/2015 0507   NA 142 04/04/2014 1249   K 4.5 10/22/2015 0507   K 3.7 04/04/2014 1249   CL 91* 10/22/2015 0507   CL 105 04/04/2014 1249   CO2 18* 10/22/2015 0507   CO2 30 04/04/2014 1249   BUN 99* 10/22/2015 0507   BUN 21* 04/04/2014 1249   CREATININE 5.97* 10/22/2015 0507   CREATININE 2.13* 04/04/2014 1249   GLUCOSE 56* 10/22/2015 0507   GLUCOSE 60* 04/04/2014 1249   CALCIUM 8.6* 10/22/2015 0507   CALCIUM 8.6 04/04/2014 1249   AST 39 11-07-2015 1636   AST 40* 02/01/2014 0812   ALT 19 November 07, 2015 1636   ALT 21 02/01/2014 0812   ALKPHOS 165* 11/07/2015 1636   ALKPHOS 135* 02/01/2014 0812   BILITOT 1.4* 11/07/2015 1636   BILITOT 1.5* 02/01/2014 0812   PROT 6.0* November 07, 2015 1636   PROT 6.9 02/01/2014 0812   ALBUMIN 3.0* 2015/11/07 1636   ALBUMIN 3.3* 02/01/2014 1610    More than 50% of the visit was spent in counseling/coordination of care: Yes  Time Spent:   40 minutes

## 2015-10-25 NOTE — Progress Notes (Signed)
PT BP in the 90's /50's, O2 SAT's  in the 80's and HR in the 40's fluctuating to the 30's and 60's. Pt alert and responds to simple questions. Pt has generalized edema including the scrotum, foley in place with no urine output. MD notified  of condition and advised to maintain pt SAT's until the morning as pt needs dialysis and Pt has no dialysis access. Rapid response was later called as pt HR kept dropping into the low 30/40's. Pt's BS was 41 and D5 ordered and admin, BS up to 109 after one hr check. MD called again and TELE and labs ordered, Pt also placed on Ventri mask as respirations in the 80's on 5 LO2.  Pt's body cold with rectal temp 65.5, heating pad placed, temp up to 97.5.  Pt had no change and continued to have abnormal VS, MD notified and request was made for the Pt to be moved as we did not feel that we could adequately take care of the PT and that he come to the floor and see the Pt.  It was noted that the ICU was full and we would have to closely monitor the Pt on this unit. Another rapid was called on the pt as there was no change in his status and he had a 5 beat run of wide QRS. At this time a STAT EKG, chest xray and Troponin were ordered. Troponin result was 0.34, MD notified and orders for another two sets of Troponin made. Pt transferred to 2A for further TX. Pt. more alert and requested the mask be taken off and the nasal canula put on. This was done and set at 5L O2. SAT's holding in the 90's.

## 2015-10-25 NOTE — Progress Notes (Signed)
Pharmacy called to update on heparin levels not being obtained, will touch base with them once we obtain them

## 2015-10-25 NOTE — Progress Notes (Addendum)
Pt lying in bed, responsive to sternal rub, incomprehensible speech, opens eyes then closes them back.  All extremities cool to touch, pt with generalized edema over whole body.  Dopamine 745mcg/kg/min infusing into lt hand.  Lab unable to obtain Heparin studies to be able to initiate Heparin gtt.  Will continue to monitor.  VSS

## 2015-10-25 NOTE — Progress Notes (Signed)
[facility]  ROUNDING NOTE   Subjective:   Worsening respiratory status. Started on dopamine gtt and transferred to ICU Son at bedside.  Anuric  Objective:  Vital signs in last 24 hours:  Temp:  [96.3 F (35.7 C)-97.7 F (36.5 C)] 96.3 F (35.7 C) (03/30 1100) Pulse Rate:  [25-165] 66 (03/30 1000) Resp:  [16] 16 (03/30 1100) BP: (80-141)/(45-98) 141/98 mmHg (03/30 1100) SpO2:  [79 %-100 %] 100 % (03/30 1000)  Weight change:  Filed Weights   10/03/2015 1533 10/05/2015 1956  Weight: 111.267 kg (245 lb 4.8 oz) 108.274 kg (238 lb 11.2 oz)    Intake/Output: I/O last 3 completed shifts: In: 325.3 [I.V.:15.3; Other:60; IV Piggyback:250] Out: 400 [Urine:400]   Intake/Output this shift:  Total I/O In: 72.1 [I.V.:72.1] Out: 125 [Urine:125]  Physical Exam: General: Critically ill  Head: Normocephalic, atraumatic. +venturi mask  Eyes: Anicteric, PERRL  Neck: Supple, trachea midline  Lungs:  Bilateral crackles  Heart: Regular rate and rhythm  Abdomen:  Soft, nontender, obese, abdominal wall edema  Extremities:  +++ peripheral edema.  Neurologic: Nonfocal, moving all four extremities  Skin: No lesions       Basic Metabolic Panel:  Recent Labs Lab 10/03/2015 1636 10/20/15 0436 10/20/15 1727 10/21/15 0509 10/22/15 0507  NA 122* 125* 124* 126* 126*  K 3.8 4.2  --  3.9 4.5  CL 89* 93*  --  93* 91*  CO2 22 18*  --  19* 18*  GLUCOSE 91 79  --  52* 56*  BUN 87* 88*  --  92* 99*  CREATININE 4.98* 4.97*  --  5.52* 5.97*  CALCIUM 8.5* 8.6*  --  8.5* 8.6*  MG  --   --   --   --  2.3    Liver Function Tests:  Recent Labs Lab 10/11/2015 1636  AST 39  ALT 19  ALKPHOS 165*  BILITOT 1.4*  PROT 6.0*  ALBUMIN 3.0*   No results for input(s): LIPASE, AMYLASE in the last 168 hours. No results for input(s): AMMONIA in the last 168 hours.  CBC:  Recent Labs Lab 10/02/2015 1555 10/20/15 0436 10/21/15 0509 10/22/15 0507  WBC 8.2 7.3 6.1 7.1  NEUTROABS 7.0*   --   --   --   HGB 10.4* 10.5* 10.5* 11.4*  HCT 31.9* 32.4* 32.1* 35.0*  MCV 78.5* 79.6* 78.3* 80.3  PLT 158 144* 149* 130*    Cardiac Enzymes:  Recent Labs Lab 11/14/15 0038  TROPONINI 0.34*    BNP: Invalid input(s): POCBNP  CBG:  Recent Labs Lab 10/22/15 2133 10/22/15 2224 11-14-15 0228 11-14-2015 0400 11/14/2015 1055  GLUCAP 41* 109* 66 179* 44    Microbiology: Results for orders placed or performed during the hospital encounter of 09/24/2015  MRSA PCR Screening     Status: None   Collection Time: 10/17/2015 11:00 PM  Result Value Ref Range Status   MRSA by PCR NEGATIVE NEGATIVE Final    Comment:        The GeneXpert MRSA Assay (FDA approved for NASAL specimens only), is one component of a comprehensive MRSA colonization surveillance program. It is not intended to diagnose MRSA infection nor to guide or monitor treatment for MRSA infections.   Culture, blood (Routine X 2) w Reflex to ID Panel     Status: None (Preliminary result)   Collection Time: 10/20/15 11:30 AM  Result Value Ref Range Status   Specimen Description BLOOD RIGHT HAND  Final   Special Requests  Final    BOTTLES DRAWN AEROBIC AND ANAEROBIC ANA .5ML AER 2ML   Culture NO GROWTH 2 DAYS  Final   Report Status PENDING  Incomplete  Culture, blood (Routine X 2) w Reflex to ID Panel     Status: None (Preliminary result)   Collection Time: 10/20/15 11:51 AM  Result Value Ref Range Status   Specimen Description BLOOD RIGHT HAND  Final   Special Requests BOTTLES DRAWN AEROBIC AND ANAEROBIC .5ML  Final   Culture NO GROWTH 2 DAYS  Final   Report Status PENDING  Incomplete    Coagulation Studies: No results for input(s): LABPROT, INR in the last 72 hours.  Urinalysis: No results for input(s): COLORURINE, LABSPEC, PHURINE, GLUCOSEU, HGBUR, BILIRUBINUR, KETONESUR, PROTEINUR, UROBILINOGEN, NITRITE, LEUKOCYTESUR in the last 72 hours.  Invalid input(s): APPERANCEUR    Imaging: US Renal  10/21/2015   CLINICAL DATA:  Gross hematuria. EXAM: RENAL / URINARY TRACT ULTRASOUND COMPLETE COMPARISON:  Ultrasound of September 22, 2015. FINDINGS: Right Kidney: Length: 8.6 cm. Multiple cysts are noted with the largest measuring 3.4 cm in upper pole. Echogenicity within normal limits. No mass or hydronephrosis visualized. Left Kidney: Length: 10.7 cm. Multiple cysts are noted with the largest measuring 5.4 cm in upper pole. Echogenicity within normal limits. No mass or hydronephrosis visualized. Bladder: Foley catheter is noted. Ascites is noted as well. IMPRESSION: Bilateral simple renal cysts are noted. Mild right renal atrophy is noted. No hydronephrosis or renal obstruction is noted. Ascites is noted. Electronically Signed   By: Marijo Conception, M.D.   On: 10/21/2015 16:05   Dg Chest Port 1 View  11-01-15  CLINICAL DATA:  Acute onset of difficulty breathing. Initial encounter. EXAM: PORTABLE CHEST 1 VIEW COMPARISON:  Chest radiograph performed 10/22/2015 FINDINGS: The lungs are hypoexpanded. A small right pleural effusion is suggested. Mild bibasilar opacities may reflect atelectasis or possibly pneumonia. No pneumothorax is seen. The cardiomediastinal silhouette is borderline normal in size. No acute osseous abnormalities are identified. Mild degenerative change is noted at both glenohumeral joints. IMPRESSION: Lungs hypoexpanded. Small right pleural effusion suggested. Mild bibasilar opacities may reflect atelectasis or possibly mild pneumonia. Findings are similar in appearance to the recent prior study. Electronically Signed   By: Garald Balding M.D.   On: Nov 01, 2015 01:19   Dg Chest Port 1 View  10/22/2015  CLINICAL DATA:  Small bowel obstruction EXAM: PORTABLE CHEST 1 VIEW COMPARISON:  Three days a.m. FINDINGS: Linear scarring in the left perihilar lung. There is superimposed hazy opacity, greater on the right, compatible with pleural fluid. Chronic cardiomegaly. No pulmonary edema seen in the upper lungs.  No air leak. IMPRESSION: 1. Low volume chest with bilateral pleural effusion. Fluid could obscure atelectasis or pneumonia. 2. Left perihilar scarring. Electronically Signed   By: Monte Fantasia M.D.   On: 10/22/2015 08:51     Medications:   . DOPamine 5 mcg/kg/min (November 01, 2015 0230)  . heparin     . aspirin  81 mg Oral Daily  . famotidine  20 mg Oral Q48H  . heparin  4,000 Units Intravenous Once  . levothyroxine  150 mcg Oral QAC breakfast  . piperacillin-tazobactam (ZOSYN)  IV  3.375 g Intravenous Q12H  . simvastatin  20 mg Oral QHS  . tamsulosin  0.4 mg Oral Daily   acetaminophen **OR** acetaminophen, ondansetron **OR** ondansetron (ZOFRAN) IV  Assessment/ Plan:  Mr. BERNABE DORCE is a 82 y.o. black male with chronic kidney disease stage III, chronic systolic heart failure  ejection fraction 30-35%, coronary artery disease, GERD, hypertension, chronic constipation, hyperlipidemia, and anxiety admitted with shortness of breath, increasing lower extremity edema in the setting of known systolic heart failure.  1. Acute renal failure on chronic kidney disease stage IV with metabolic acidosis: baseline creatinine 2.21, eGFR of 29 from 3/2 2. Hyponatremia: hypervolemic.  3. Acute on chronic systolic and diastolic heart failure:  4. Hypotension  Not a candidate for dialysis at this time. Critically ill. Discussed prognosis with patient. At this time, will look into comfort measures. Son is agreeable but needs to inform rest of his family.  Code status to changed to DNI/DNR Discussed case with Dr. Stevenson Clinch.     LOS: [place], Harlen Danford 2017-10-2209:49 AM

## 2015-10-25 NOTE — Clinical Social Work Note (Signed)
Pt expired last night. CSW is signing off as no further needs identified.   Dede QuerySarah Mialynn Shelvin, MSW, LCSW  Clinical Social Worker  [phone]

## 2015-10-25 NOTE — Progress Notes (Signed)
RN call reporting pt with episodes of bradycardia with HR ~ 40's. bp ~ 100 systolic. Had low blood sugars, received D 50.  Chart reviewed. Advised tele monitoring.  Ordered stat cxr, ekg, labs- troponin. Pt evaluated at bed side.  Drowsy but arousable, denies chest pain, not able to elicit further hx. No respiratory distress noted. HR now ~ 68. SBP maintaining~ 100. cxr report small rt effusion, b/l basal atelectasis. Troponin elevated at 0.34. Ordered heparin drip. Pt o ASA. BB not administered b/o bradcardia. Cycle cardiac enzymes. Cardiology consult requested.

## 2015-10-25 NOTE — Progress Notes (Signed)
Telephone report called to Chris,RN on 1C.  Patient to be moved to room 104.

## 2015-10-25 NOTE — Progress Notes (Signed)
Fleet Contrasachel, RN given report on patient, waiting for clean bed then will transfer over to ICU.  BP remains stable.

## 2015-10-25 NOTE — Consult Note (Signed)
Dennis Rice is a 82 y.o. male  [id number]  Primary Cardiologist: Adrian Blackwater Reason for Consultation: Bradycardia  HPI: Mr. Barth is an 82 year old male admitted to the hospital with acute on chronic respiratory failure pulmonary edema and effusions. He also has acute on chronic renal failure stage IV. He has profound edema and is being diuresed. He has developed bradycardia with a heart rate in the 40s. He is currently on a dopamine drip and is being transferred to ICU.    Review of Systems: Unable to complete due to patient unresponsiveness   Past Medical History  Diagnosis Date  . GERD (gastroesophageal reflux disease)   . PAD (peripheral artery disease) (HCC)   . Hypertension   . Coronary artery disease   . Back pain     Chronic  . BPH (benign prostatic hypertrophy)   . Kidney stones   . Hypothyroidism   . Hyperlipidemia   . OSA on CPAP     Medications Prior to Admission  Medication Sig Dispense Refill  . aspirin 81 MG chewable tablet Chew 81 mg by mouth daily.    . calcitRIOL (ROCALTROL) 0.25 MCG capsule Take 0.25 mcg by mouth daily.    . carvedilol (COREG) 6.25 MG tablet Take 6.25 mg by mouth 2 (two) times daily.    . cimetidine (TAGAMET) 200 MG tablet Take 200 mg by mouth 2 (two) times daily.    . furosemide (LASIX) 40 MG tablet Take 1 tablet (40 mg total) by mouth daily. (Patient taking differently: Take 80 mg by mouth 2 (two) times daily. ) 30 tablet 0  . isosorbide-hydrALAZINE (BIDIL) 20-37.5 MG per tablet Take 1 tablet by mouth 3 (three) times daily.    . metolazone (ZAROXOLYN) 2.5 MG tablet Take 2.5 mg by mouth 2 (two) times daily.    . simvastatin (ZOCOR) 20 MG tablet Take 20 mg by mouth at bedtime.    . sodium polystyrene (SPS) 15 GM/60ML suspension Take 30 g by mouth daily.    Marland Kitchen SYNTHROID 150 MCG tablet Take 150 mcg by mouth every morning.        Marland Kitchen aspirin  81 mg Oral Daily  . famotidine  20 mg Oral Q48H  . heparin  4,000 Units Intravenous Once  .  levothyroxine  150 mcg Oral QAC breakfast  . piperacillin-tazobactam (ZOSYN)  IV  3.375 g Intravenous Q12H  . simvastatin  20 mg Oral QHS  . tamsulosin  0.4 mg Oral Daily    Infusions: . DOPamine 5 mcg/kg/min (09/25/2015 0230)  . heparin      No Known Allergies  Social History   Social History  . Marital Status: Widowed    Spouse Name: N/A  . Number of Children: N/A  . Years of Education: N/A   Occupational History  . Not on file.   Social History Main Topics  . Smoking status: Never Smoker   . Smokeless tobacco: Never Used  . Alcohol Use: No  . Drug Use: No  . Sexual Activity: Not on file   Other Topics Concern  . Not on file   Social History Narrative    History reviewed. No pertinent family history.  PHYSICAL EXAM: Filed Vitals:   10/16/2015 0758 10/22/2015 0900  BP: 137/76 139/70  Pulse: 72 65  Temp: 97.5 F (36.4 C)   Resp:       Intake/Output Summary (Last 24 hours) at 10/22/2015 0946 Last data filed at 10/13/2015 0400  Gross per 24 hour  Intake  325.3  ml  Output    200 ml  Net  125.3 ml    General:  Ill-appearing wearing a 100% nonrebreather mask HEENT: normal Neck: supple. no JVD. Carotids 2+ bilat; no bruits. No lymphadenopathy or thryomegaly appreciated. Cor: PMI nondisplaced. Regular rate & rhythm. No rubs, gallops or murmurs. Lungs: clear Abdomen: soft, nontender, nondistended. No hepatosplenomegaly. No bruits or masses. Good bowel sounds. Extremities: Profound generalized edema Neuro: Unresponsive  ECG: Atrial fibrillation with a slow ventricular response  Results for orders placed or performed during the hospital encounter of 11-10-2015 (from the past 24 hour(s))  Glucose, capillary     Status: Abnormal   Collection Time: 10/22/15  9:33 PM  Result Value Ref Range   Glucose-Capillary 41 (LL) 65 - 99 mg/dL  Glucose, capillary     Status: Abnormal   Collection Time: 10/22/15 10:24 PM  Result Value Ref Range   Glucose-Capillary 109 (H) 65 - 99  mg/dL  Troponin I     Status: Abnormal   Collection Time: 10/03/2015 12:38 AM  Result Value Ref Range   Troponin I 0.34 (H) <0.031 ng/mL  Glucose, capillary     Status: None   Collection Time: 10/24/2015  2:28 AM  Result Value Ref Range   Glucose-Capillary 66 65 - 99 mg/dL   Comment 1 Notify RN   Glucose, capillary     Status: Abnormal   Collection Time: 10/07/2015  4:00 AM  Result Value Ref Range   Glucose-Capillary 179 (H) 65 - 99 mg/dL   Comment 1 Notify RN    US Renal  10/21/2015  CLINICAL DATA:  Gross hematuria. EXAM: RENAL / URINARY TRACT ULTRASOUND COMPLETE COMPARISON:  Ultrasound of September 22, 2015. FINDINGS: Right Kidney: Length: 8.6 cm. Multiple cysts are noted with the largest measuring 3.4 cm in upper pole. Echogenicity within normal limits. No mass or hydronephrosis visualized. Left Kidney: Length: 10.7 cm. Multiple cysts are noted with the largest measuring 5.4 cm in upper pole. Echogenicity within normal limits. No mass or hydronephrosis visualized. Bladder: Foley catheter is noted. Ascites is noted as well. IMPRESSION: Bilateral simple renal cysts are noted. Mild right renal atrophy is noted. No hydronephrosis or renal obstruction is noted. Ascites is noted. Electronically Signed   By: Lupita Raider, M.D.   On: 10/21/2015 16:05   Dg Chest Port 1 View  10/04/2015  CLINICAL DATA:  Acute onset of difficulty breathing. Initial encounter. EXAM: PORTABLE CHEST 1 VIEW COMPARISON:  Chest radiograph performed 10/22/2015 FINDINGS: The lungs are hypoexpanded. A small right pleural effusion is suggested. Mild bibasilar opacities may reflect atelectasis or possibly pneumonia. No pneumothorax is seen. The cardiomediastinal silhouette is borderline normal in size. No acute osseous abnormalities are identified. Mild degenerative change is noted at both glenohumeral joints. IMPRESSION: Lungs hypoexpanded. Small right pleural effusion suggested. Mild bibasilar opacities may reflect atelectasis or  possibly mild pneumonia. Findings are similar in appearance to the recent prior study. Electronically Signed   By: Roanna Raider M.D.   On: 09/29/2015 01:19   Dg Chest Port 1 View  10/22/2015  CLINICAL DATA:  Small bowel obstruction EXAM: PORTABLE CHEST 1 VIEW COMPARISON:  Three days a.m. FINDINGS: Linear scarring in the left perihilar lung. There is superimposed hazy opacity, greater on the right, compatible with pleural fluid. Chronic cardiomegaly. No pulmonary edema seen in the upper lungs. No air leak. IMPRESSION: 1. Low volume chest with bilateral pleural effusion. Fluid could obscure atelectasis or pneumonia. 2. Left perihilar scarring. Electronically Signed  By: Marnee SpringJonathon  Watts M.D.   On: 10/22/2015 08:51     ASSESSMENT AND PLAN: Patient with multisystem issues including respiratory failure, renal failure and chronic systolic heart failure with an EF of 35% by echo in February 2017. The patient was unresponsive during my assessment. Patient is in atrial fibrillation with a slow ventricular response. Bradycardia may be in response to multisystem organ failure. Prognosis is poor. Advise addressing CODE STATUS.  Advise an echocardiogram if indicated insetting of other problems.  Berton BonJanine Ashdon Gillson, NP April 07, 2016 9:46 AM

## 2015-10-25 NOTE — Progress Notes (Signed)
Called by nursing staff given worsening condition  Patient NRB 100%, dopamine gtt, responsive to painful stimuli only spoke to son over the phone to update on worsening condition/poor prognosis Remains full code for now but very realistic as this may not be beneficial -- to discuss with other family members about potential code change-- full note to follow

## 2015-10-25 NOTE — Progress Notes (Signed)
BP remains stable 131/68.  Dopamine remains at 495mcg/kg/min in left hand.  No verbal responses.  Foley with bloody urine noted.  Awaiting ICU bed.

## 2015-10-25 NOTE — Progress Notes (Signed)
Patient is having multi system failure, agree with making him DNR.

## 2015-10-25 NOTE — Progress Notes (Signed)
CSW presented bed offers to patient and his family. [facility] Patient was transferred from 2A to ICU. CSW was consulted for comfort care. Informed that patient was changed to comfort care and code status updated to DNR. CSW will continue to follow and assist.   Woodroe Modehristina Rowan Pollman, MSW, LCSW-A Clinical Social Work Department [phone]

## 2015-10-25 NOTE — Progress Notes (Signed)
ANTICOAGULATION CONSULT NOTE - Initial Consult  Pharmacy Consult for heparin Indication: chest pain/ACS  No Known Allergies  Patient Measurements: Height: 5\' 9"  (175.3 cm) Weight: 238 lb 11.2 oz (108.274 kg) IBW/kg (Calculated) : 70.7 Heparin Dosing Weight: 94.3 kg  Vital Signs: Temp: 97.3 F (36.3 C) (03/29 2323) Temp Source: Oral (03/29 2323) BP: 96/60 mmHg (03/30 0347) Pulse Rate: 75 (03/30 0347)  Labs:  Recent Labs  10/20/15 0436 10/21/15 0509 10/22/15 0507 07/17/16 0038  HGB 10.5* 10.5* 11.4*  --   HCT 32.4* 32.1* 35.0*  --   PLT 144* 149* 130*  --   CREATININE 4.97* 5.52* 5.97*  --   TROPONINI  --   --   --  0.34*    Estimated Creatinine Clearance: 10.2 mL/min (by C-G formula based on Cr of 5.97).   Medical History: Past Medical History  Diagnosis Date  . GERD (gastroesophageal reflux disease)   . PAD (peripheral artery disease) (HCC)   . Hypertension   . Coronary artery disease   . Back pain     Chronic  . BPH (benign prostatic hypertrophy)   . Kidney stones   . Hypothyroidism   . Hyperlipidemia   . OSA on CPAP     Medications:  Infusions:  . DOPamine 5 mcg/kg/min (07/17/16 0230)  . heparin      Assessment: 89 yom admitted 09/29/2015 for acute renal failure starting heparin drip for ACS/STEMI. Rapid response this evening, troponin positive. Pharmacy consulted to dose heparin for ACS.   Goal of Therapy:  Heparin level 0.3-0.7 units/ml Monitor platelets by anticoagulation protocol: Yes   Plan:  Give 4000 units bolus x 1 Start heparin infusion at 1000 units/hr Check anti-Xa level in 8 hours and daily while on heparin Continue to monitor H&H and platelets  Carola FrostNathan A Arietta Eisenstein, Pharm.D., BCPS Clinical Pharmacist 10-21-2015,4:01 AM

## 2015-10-25 NOTE — Progress Notes (Signed)
Dr. Clint GuyHower in to evaluate pt, called patients son and updated on condition

## 2015-10-25 NOTE — Progress Notes (Signed)
Pt ordered heparin gtt, pharmacy awaiting PT and PTT results. Phlebotomy attempted two sticks and were unsuccessful. Dr Tobi BastosPyreddy paged x2 to notify, no call back as of now. Pharmacy aware

## 2015-10-25 NOTE — Progress Notes (Signed)
Dr. Dema SeverinMungal made aware of critical patient transferred to ICU. Patient with abnormal assessment.  Patient to be seen.

## 2015-10-25 NOTE — Care Management (Signed)
Patient from [facility]- admitted to [facility] then had rapid response called and transferred to 2A.  Patient had another decline in status and was transferred to ICU on dopamine drip and 100% nonrebreather.  He is a full code and palliative care consult has been made due to code status and poor prognosis

## 2015-10-25 NOTE — Progress Notes (Signed)
[facility] - [place] at [facility]   PATIENT NAME: Dennis Rice    MR#:  [id number]  DATE OF BIRTH:  1923/07/30  SUBJECTIVE:   Overnight events noted, rapid response, increased work of breathing Patient on dopamine drip, nonrebreather this morning essentially unresponsive  REVIEW OF SYSTEMS:    Review of Systems  Unable to perform ROS: patient unresponsive       DRUG ALLERGIES:  No Known Allergies  VITALS:  Blood pressure 141/98, pulse 66, temperature 96.3 F (35.7 C), temperature source Axillary, resp. rate 16, height  (1.753 m), weight 108.274 kg (238 lb 11.2 oz), SpO2 100 %.  PHYSICAL EXAMINATION:   Physical Exam  VITAL SIGNS: Filed Vitals:   2015-11-13 1000 13-Nov-2015 1100  BP: 131/68 141/98  Pulse: 66   Temp:  96.3 F (35.7 C)  Resp:  16   GENERAL:82 y.o.male critically ill given mental status and respiratory status HEAD: Normocephalic, atraumatic.  EYES: Pupils equal, round, reactive to light. Unable to assess extraocular muscles given mental status/medical condition. No scleral icterus.  MOUTH: Moist mucosal membrane. Dentition intact. No abscess noted.  EAR, NOSE, THROAT: Clear without exudates. No external lesions.  NECK: Supple. No thyromegaly. No nodules. No JVD.  PULMONARY: Diffuse coarse rhonchi without wheeze rails or rhonci. No use of accessory muscles, Good respiratory effort. good air entry bilaterally CHEST: Nontender to palpation.  CARDIOVASCULAR: S1 and S2. Regular rate and rhythm. No murmurs, rubs, or gallops. Anasarca, diminished pedal pulses GASTROINTESTINAL: Soft, nontender, nondistended. No masses. Positive bowel sounds. No hepatosplenomegaly.  MUSCULOSKELETAL: No swelling, clubbing, or edema. Range of motion full in all extremities.  NEUROLOGIC: Unable to assess given mental status/medical condition SKIN: No ulceration, lesions, rashes, or cyanosis. Skin warm and dry. Turgor intact.  PSYCHIATRIC: Unable to  assess given mental status/medical condition      LABORATORY PANEL:   CBC  Recent Labs Lab 10/22/15 0507  WBC 7.1  HGB 11.4*  HCT 35.0*  PLT 130*   ------------------------------------------------------------------------------------------------------------------  Chemistries   Recent Labs Lab 10/20/2015 1636  10/22/15 0507  NA 122*  < > 126*  K 3.8  < > 4.5  CL 89*  < > 91*  CO2 22  < > 18*  GLUCOSE 91  < > 56*  BUN 87*  < > 99*  CREATININE 4.98*  < > 5.97*  CALCIUM 8.5*  < > 8.6*  MG  --   --  2.3  AST 39  --   --   ALT 19  --   --   ALKPHOS 165*  --   --   BILITOT 1.4*  --   --   < > = values in this interval not displayed. ------------------------------------------------------------------------------------------------------------------  Cardiac Enzymes  Recent Labs Lab 2015/11/13 0038  TROPONINI 0.34*   ------------------------------------------------------------------------------------------------------------------  RADIOLOGY:  US Renal  10/21/2015  CLINICAL DATA:  Gross hematuria. EXAM: RENAL / URINARY TRACT ULTRASOUND COMPLETE COMPARISON:  Ultrasound of September 22, 2015. FINDINGS: Right Kidney: Length: 8.6 cm. Multiple cysts are noted with the largest measuring 3.4 cm in upper pole. Echogenicity within normal limits. No mass or hydronephrosis visualized. Left Kidney: Length: 10.7 cm. Multiple cysts are noted with the largest measuring 5.4 cm in upper pole. Echogenicity within normal limits. No mass or hydronephrosis visualized. Bladder: Foley catheter is noted. Ascites is noted as well. IMPRESSION: Bilateral simple renal cysts are noted. Mild right renal atrophy is noted. No hydronephrosis or renal obstruction is noted. Ascites is noted. Electronically  Signed   By: Lupita RaiderJames  Green Jr, M.D.   On: 10/21/2015 16:05   Dg Chest Port 1 View  07-Dec-2015  CLINICAL DATA:  Acute onset of difficulty breathing. Initial encounter. EXAM: PORTABLE CHEST 1 VIEW COMPARISON:   Chest radiograph performed 10/22/2015 FINDINGS: The lungs are hypoexpanded. A small right pleural effusion is suggested. Mild bibasilar opacities may reflect atelectasis or possibly pneumonia. No pneumothorax is seen. The cardiomediastinal silhouette is borderline normal in size. No acute osseous abnormalities are identified. Mild degenerative change is noted at both glenohumeral joints. IMPRESSION: Lungs hypoexpanded. Small right pleural effusion suggested. Mild bibasilar opacities may reflect atelectasis or possibly mild pneumonia. Findings are similar in appearance to the recent prior study. Electronically Signed   By: Roanna RaiderJeffery  Chang M.D.   On: 014-May-2017 01:19   Dg Chest Port 1 View  10/22/2015  CLINICAL DATA:  Small bowel obstruction EXAM: PORTABLE CHEST 1 VIEW COMPARISON:  Three days a.m. FINDINGS: Linear scarring in the left perihilar lung. There is superimposed hazy opacity, greater on the right, compatible with pleural fluid. Chronic cardiomegaly. No pulmonary edema seen in the upper lungs. No air leak. IMPRESSION: 1. Low volume chest with bilateral pleural effusion. Fluid could obscure atelectasis or pneumonia. 2. Left perihilar scarring. Electronically Signed   By: Marnee SpringJonathon  Watts M.D.   On: 10/22/2015 08:51     ASSESSMENT AND PLAN:    82 year old male with history of chronic kidney disease stage III and hypothyroidism who presents with anasarca and acute on chronic renal failure. 1. Acute on chronic respiratory failureWith hypoxia: Continue nonrebreather 100%, Continue with Lasix, as patient full code will transfer to intensive care unit anticipate intubation unless CODE STATUS change 2. Acute on chronic renal failure Stage IV: Worsening creatinine today due to IV Lasix. Patient has cardiorenal syndrome and will need ongoing monitoring of urine output and creatinine. 4. Hypothyroidism: TSH within normal limits.  Continue Synthroid  5. Anasarca: Likely due to acute on chronic renal  failure and acute on chronic systolic heart failure EF 35% with severe concentric LVH on echo February 2017. Management as per nephrology.  Lasix 40 mg IV twice a day Monitor input and output.  6. Hematuria with Foley catheter: Patient presented from nursing home with Foley catheter noted to have hematuria, likely from trauma now has CBI placed in the emergency room.  Currently Not draining bloody urine. Stop CBI Continue Flomax .  Disposition: Stepdown/ICU transfer, case discussed with son as stated earlier   CODE STATUS: full  TOTAL TIME TAKING CARE OF THIS PATIENT:33 critical care minutes.     POSSIBLE D/C 3-5 days, DEPENDING ON CLINICAL CONDITION.   Harmonie Verrastro,  Mardi MainlandDavid K M.D on 07-Dec-2015 at 2:56 PM  Between 7am to 6pm - Pager - [phone] After 6pm go to [email or web address] - password [email or web address] [facility]  [facility]  Office  [phone]  CC: Primary care physician; Derwood KaplanEason,  Ernest B, MD  Note: This dictation was prepared with Dragon dictation along with smaller phrase technology. Any transcriptional errors that result from this process are unintentional.

## 2015-10-25 DEATH — deceased

## 2015-10-31 ENCOUNTER — Ambulatory Visit: Payer: Medicare Other | Admitting: Family
